# Patient Record
Sex: Female | Born: 1950 | Race: White | Hispanic: No | Marital: Married | Smoking: Never smoker
Health system: Southern US, Community
[De-identification: ages and names within clinical notes are randomized; demographics above are authoritative.]

## PROBLEM LIST (undated history)

## (undated) DIAGNOSIS — F419 Anxiety disorder, unspecified: Secondary | ICD-10-CM

## (undated) DIAGNOSIS — I10 Essential (primary) hypertension: Secondary | ICD-10-CM

## (undated) DIAGNOSIS — K219 Gastro-esophageal reflux disease without esophagitis: Secondary | ICD-10-CM

## (undated) DIAGNOSIS — F411 Generalized anxiety disorder: Secondary | ICD-10-CM

## (undated) DIAGNOSIS — M1990 Unspecified osteoarthritis, unspecified site: Secondary | ICD-10-CM

## (undated) HISTORY — DX: Essential (primary) hypertension: I10

## (undated) HISTORY — PX: OTHER SURGICAL HISTORY: SHX169

## (undated) HISTORY — PX: KNEE ARTHROSCOPY W/ DEBRIDEMENT: SHX1867

## (undated) HISTORY — PX: BUNIONECTOMY: SHX129

## (undated) HISTORY — PX: CHOLECYSTECTOMY: SHX55

## (undated) HISTORY — PX: ABDOMINAL HYSTERECTOMY: SHX81

## (undated) HISTORY — PX: BACK SURGERY: SHX140

## (undated) HISTORY — DX: Generalized anxiety disorder: F41.1

## (undated) HISTORY — PX: NECK EXPLORATION: SHX2077

---

## 2015-01-31 DIAGNOSIS — Z4889 Encounter for other specified surgical aftercare: Secondary | ICD-10-CM | POA: Diagnosis not present

## 2015-01-31 DIAGNOSIS — W101XXD Fall (on)(from) sidewalk curb, subsequent encounter: Secondary | ICD-10-CM | POA: Diagnosis not present

## 2015-01-31 DIAGNOSIS — M25562 Pain in left knee: Secondary | ICD-10-CM | POA: Diagnosis not present

## 2015-01-31 DIAGNOSIS — R293 Abnormal posture: Secondary | ICD-10-CM | POA: Diagnosis not present

## 2015-01-31 DIAGNOSIS — M23307 Other meniscus derangements, unspecified meniscus, left knee: Secondary | ICD-10-CM | POA: Diagnosis not present

## 2015-01-31 DIAGNOSIS — M6281 Muscle weakness (generalized): Secondary | ICD-10-CM | POA: Diagnosis not present

## 2015-02-02 DIAGNOSIS — Z4889 Encounter for other specified surgical aftercare: Secondary | ICD-10-CM | POA: Diagnosis not present

## 2015-02-02 DIAGNOSIS — R293 Abnormal posture: Secondary | ICD-10-CM | POA: Diagnosis not present

## 2015-02-02 DIAGNOSIS — W101XXD Fall (on)(from) sidewalk curb, subsequent encounter: Secondary | ICD-10-CM | POA: Diagnosis not present

## 2015-02-02 DIAGNOSIS — M25562 Pain in left knee: Secondary | ICD-10-CM | POA: Diagnosis not present

## 2015-02-02 DIAGNOSIS — M23307 Other meniscus derangements, unspecified meniscus, left knee: Secondary | ICD-10-CM | POA: Diagnosis not present

## 2015-02-02 DIAGNOSIS — M6281 Muscle weakness (generalized): Secondary | ICD-10-CM | POA: Diagnosis not present

## 2015-02-18 DIAGNOSIS — I82409 Acute embolism and thrombosis of unspecified deep veins of unspecified lower extremity: Secondary | ICD-10-CM | POA: Diagnosis not present

## 2015-03-28 DIAGNOSIS — R0789 Other chest pain: Secondary | ICD-10-CM | POA: Diagnosis not present

## 2015-03-28 DIAGNOSIS — Z789 Other specified health status: Secondary | ICD-10-CM | POA: Diagnosis not present

## 2015-03-28 DIAGNOSIS — I10 Essential (primary) hypertension: Secondary | ICD-10-CM | POA: Diagnosis not present

## 2015-04-13 DIAGNOSIS — R5383 Other fatigue: Secondary | ICD-10-CM | POA: Diagnosis not present

## 2015-04-13 DIAGNOSIS — Z7189 Other specified counseling: Secondary | ICD-10-CM | POA: Diagnosis not present

## 2015-04-13 DIAGNOSIS — Z79899 Other long term (current) drug therapy: Secondary | ICD-10-CM | POA: Diagnosis not present

## 2015-04-13 DIAGNOSIS — E785 Hyperlipidemia, unspecified: Secondary | ICD-10-CM | POA: Diagnosis not present

## 2015-04-13 DIAGNOSIS — Z0000 Encounter for general adult medical examination without abnormal findings: Secondary | ICD-10-CM | POA: Diagnosis not present

## 2015-04-13 DIAGNOSIS — E559 Vitamin D deficiency, unspecified: Secondary | ICD-10-CM | POA: Diagnosis not present

## 2015-04-13 DIAGNOSIS — Z299 Encounter for prophylactic measures, unspecified: Secondary | ICD-10-CM | POA: Diagnosis not present

## 2015-04-13 DIAGNOSIS — Z6838 Body mass index (BMI) 38.0-38.9, adult: Secondary | ICD-10-CM | POA: Diagnosis not present

## 2015-04-13 DIAGNOSIS — Z1389 Encounter for screening for other disorder: Secondary | ICD-10-CM | POA: Diagnosis not present

## 2015-04-13 DIAGNOSIS — F419 Anxiety disorder, unspecified: Secondary | ICD-10-CM | POA: Diagnosis not present

## 2015-04-15 ENCOUNTER — Encounter (INDEPENDENT_AMBULATORY_CARE_PROVIDER_SITE_OTHER): Payer: Self-pay | Admitting: *Deleted

## 2015-04-20 DIAGNOSIS — H04123 Dry eye syndrome of bilateral lacrimal glands: Secondary | ICD-10-CM | POA: Diagnosis not present

## 2015-04-27 ENCOUNTER — Encounter (INDEPENDENT_AMBULATORY_CARE_PROVIDER_SITE_OTHER): Payer: Self-pay

## 2015-04-27 ENCOUNTER — Encounter (INDEPENDENT_AMBULATORY_CARE_PROVIDER_SITE_OTHER): Payer: Self-pay | Admitting: *Deleted

## 2015-04-27 ENCOUNTER — Other Ambulatory Visit (INDEPENDENT_AMBULATORY_CARE_PROVIDER_SITE_OTHER): Payer: Self-pay | Admitting: *Deleted

## 2015-04-27 DIAGNOSIS — Z1211 Encounter for screening for malignant neoplasm of colon: Secondary | ICD-10-CM

## 2015-05-19 DIAGNOSIS — E2839 Other primary ovarian failure: Secondary | ICD-10-CM | POA: Diagnosis not present

## 2015-05-19 DIAGNOSIS — Z1231 Encounter for screening mammogram for malignant neoplasm of breast: Secondary | ICD-10-CM | POA: Diagnosis not present

## 2015-06-01 DIAGNOSIS — L821 Other seborrheic keratosis: Secondary | ICD-10-CM | POA: Diagnosis not present

## 2015-06-01 DIAGNOSIS — D239 Other benign neoplasm of skin, unspecified: Secondary | ICD-10-CM | POA: Diagnosis not present

## 2015-06-01 DIAGNOSIS — L219 Seborrheic dermatitis, unspecified: Secondary | ICD-10-CM | POA: Diagnosis not present

## 2015-07-07 ENCOUNTER — Encounter (INDEPENDENT_AMBULATORY_CARE_PROVIDER_SITE_OTHER): Payer: Self-pay | Admitting: *Deleted

## 2015-07-07 ENCOUNTER — Telehealth (INDEPENDENT_AMBULATORY_CARE_PROVIDER_SITE_OTHER): Payer: Self-pay | Admitting: *Deleted

## 2015-07-07 ENCOUNTER — Other Ambulatory Visit (INDEPENDENT_AMBULATORY_CARE_PROVIDER_SITE_OTHER): Payer: Self-pay | Admitting: *Deleted

## 2015-07-07 MED ORDER — PEG 3350-KCL-NA BICARB-NACL 420 G PO SOLR: 4000.0000 mL | Freq: Once | ORAL | Status: DC

## 2015-07-07 NOTE — Telephone Encounter (Signed)
agree

## 2015-07-07 NOTE — Telephone Encounter (Signed)
Patient needs trilyte 

## 2015-07-07 NOTE — Telephone Encounter (Signed)
Referring MD/PCP: shah   Procedure: tcs  Reason/Indication:  screening  Has patient had this procedure before?  Yes, 10 yrs ago, scanned  If so, when, by whom and where?    Is there a family history of colon cancer?  no  Who?  What age when diagnosed?    Is patient diabetic?   no      Does patient have prosthetic heart valve or mechanical valve?  no  Do you have a pacemaker?  no  Has patient ever had endocarditis? no  Has patient had joint replacement within last 12 months?  no  Does patient tend to be constipated or take laxatives? no  Does patient have a history of alcohol/drug use?  no  Is patient on Coumadin, Plavix and/or Aspirin? yes  Medications: asa 81 mg daily, amlodipine 5/160 mg daily, hctz 25 mg daily, escitalopram 5 mg daily, diazepam 5 mg prn, dycyclomine 20 mg daily, fish oil 1200 mg daily, red yeast rice 600 mg bid, nexium 20 mg bid, potassium daily, vit d 5000 unit daily  Allergies: demerol, augmentin, lipitor  Medication Adjustment: asa 2 days  Procedure date & time: 08/04/15 at 730

## 2015-07-20 DIAGNOSIS — Z299 Encounter for prophylactic measures, unspecified: Secondary | ICD-10-CM | POA: Diagnosis not present

## 2015-07-20 DIAGNOSIS — I10 Essential (primary) hypertension: Secondary | ICD-10-CM | POA: Diagnosis not present

## 2015-07-20 DIAGNOSIS — E785 Hyperlipidemia, unspecified: Secondary | ICD-10-CM | POA: Diagnosis not present

## 2015-08-04 ENCOUNTER — Encounter (HOSPITAL_COMMUNITY)
Admission: RE | Disposition: A | Discharge status: Home / Self Care | Payer: Self-pay | Source: Ambulatory Visit | Attending: Internal Medicine

## 2015-08-04 ENCOUNTER — Ambulatory Visit (HOSPITAL_COMMUNITY)
Admission: RE | Admit: 2015-08-04 | Discharge: 2015-08-04 | Disposition: A | Discharge status: Home / Self Care | Payer: Medicare Other | Source: Ambulatory Visit | Attending: Internal Medicine | Admitting: Internal Medicine

## 2015-08-04 ENCOUNTER — Encounter (HOSPITAL_COMMUNITY): Payer: Self-pay

## 2015-08-04 DIAGNOSIS — Z90710 Acquired absence of both cervix and uterus: Secondary | ICD-10-CM | POA: Diagnosis not present

## 2015-08-04 DIAGNOSIS — K5730 Diverticulosis of large intestine without perforation or abscess without bleeding: Secondary | ICD-10-CM | POA: Insufficient documentation

## 2015-08-04 DIAGNOSIS — K644 Residual hemorrhoidal skin tags: Secondary | ICD-10-CM | POA: Insufficient documentation

## 2015-08-04 DIAGNOSIS — K6289 Other specified diseases of anus and rectum: Secondary | ICD-10-CM | POA: Insufficient documentation

## 2015-08-04 DIAGNOSIS — K219 Gastro-esophageal reflux disease without esophagitis: Secondary | ICD-10-CM | POA: Insufficient documentation

## 2015-08-04 DIAGNOSIS — F419 Anxiety disorder, unspecified: Secondary | ICD-10-CM | POA: Diagnosis not present

## 2015-08-04 DIAGNOSIS — Z9049 Acquired absence of other specified parts of digestive tract: Secondary | ICD-10-CM | POA: Insufficient documentation

## 2015-08-04 DIAGNOSIS — Z7982 Long term (current) use of aspirin: Secondary | ICD-10-CM | POA: Diagnosis not present

## 2015-08-04 DIAGNOSIS — Z881 Allergy status to other antibiotic agents status: Secondary | ICD-10-CM | POA: Diagnosis not present

## 2015-08-04 DIAGNOSIS — M1990 Unspecified osteoarthritis, unspecified site: Secondary | ICD-10-CM | POA: Insufficient documentation

## 2015-08-04 DIAGNOSIS — Z885 Allergy status to narcotic agent status: Secondary | ICD-10-CM | POA: Insufficient documentation

## 2015-08-04 DIAGNOSIS — Z888 Allergy status to other drugs, medicaments and biological substances status: Secondary | ICD-10-CM | POA: Diagnosis not present

## 2015-08-04 DIAGNOSIS — Z79899 Other long term (current) drug therapy: Secondary | ICD-10-CM | POA: Diagnosis not present

## 2015-08-04 DIAGNOSIS — I10 Essential (primary) hypertension: Secondary | ICD-10-CM | POA: Insufficient documentation

## 2015-08-04 DIAGNOSIS — Z1211 Encounter for screening for malignant neoplasm of colon: Secondary | ICD-10-CM | POA: Diagnosis not present

## 2015-08-04 HISTORY — PX: COLONOSCOPY: SHX5424

## 2015-08-04 HISTORY — DX: Gastro-esophageal reflux disease without esophagitis: K21.9

## 2015-08-04 HISTORY — DX: Anxiety disorder, unspecified: F41.9

## 2015-08-04 HISTORY — DX: Essential (primary) hypertension: I10

## 2015-08-04 HISTORY — DX: Unspecified osteoarthritis, unspecified site: M19.90

## 2015-08-04 SURGERY — COLONOSCOPY
Anesthesia: Moderate Sedation

## 2015-08-04 MED ORDER — MIDAZOLAM HCL 5 MG/5ML IJ SOLN
INTRAMUSCULAR | Status: DC | PRN
  Administered 2015-08-04 (×5): 2 mg via INTRAVENOUS

## 2015-08-04 MED ORDER — STERILE WATER FOR IRRIGATION IR SOLN
Status: DC | PRN
  Administered 2015-08-04: 2.5 mL

## 2015-08-04 MED ORDER — SODIUM CHLORIDE 0.9 % IV SOLN
INTRAVENOUS | Status: DC
  Administered 2015-08-04: 07:00:00 via INTRAVENOUS

## 2015-08-04 MED ORDER — MIDAZOLAM HCL 5 MG/5ML IJ SOLN
INTRAMUSCULAR | Status: AC
  Filled 2015-08-04: qty 10

## 2015-08-04 MED ORDER — FENTANYL CITRATE (PF) 100 MCG/2ML IJ SOLN
INTRAMUSCULAR | Status: DC
Start: 2015-08-04 — End: 2015-08-04
  Filled 2015-08-04: qty 2

## 2015-08-04 MED ORDER — FENTANYL CITRATE (PF) 100 MCG/2ML IJ SOLN
INTRAMUSCULAR | Status: DC | PRN
Start: 2015-08-04 — End: 2015-08-04
  Administered 2015-08-04 (×2): 25 ug via INTRAVENOUS

## 2015-08-04 MED ORDER — MEPERIDINE HCL 50 MG/ML IJ SOLN
INTRAMUSCULAR | Status: AC
  Filled 2015-08-04: qty 1

## 2015-08-04 NOTE — Discharge Instructions (Signed)
Resume usual medications and high fiber diet. °No driving for 24 hours. °Next screening exam in 10 years. ° °Colonoscopy, Care After °Refer to this sheet in the next few weeks. These instructions provide you with information on caring for yourself after your procedure. Your health care provider may also give you more specific instructions. Your treatment has been planned according to current medical practices, but problems sometimes occur. Call your health care provider if you have any problems or questions after your procedure. °WHAT TO EXPECT AFTER THE PROCEDURE  °After your procedure, it is typical to have the following: °· A small amount of blood in your stool. °· Moderate amounts of gas and mild abdominal cramping or bloating. °HOME CARE INSTRUCTIONS °· Do not drive, operate machinery, or sign important documents for 24 hours. °· You may shower and resume your regular physical activities, but move at a slower pace for the first 24 hours. °· Take frequent rest periods for the first 24 hours. °· Walk around or put a warm pack on your abdomen to help reduce abdominal cramping and bloating. °· Drink enough fluids to keep your urine clear or pale yellow. °· You may resume your normal diet as instructed by your health care provider. Avoid heavy or fried foods that are hard to digest. °· Avoid drinking alcohol for 24 hours or as instructed by your health care provider. °· Only take over-the-counter or prescription medicines as directed by your health care provider. °· If a tissue sample (biopsy) was taken during your procedure: °¨ Do not take aspirin or blood thinners for 7 days, or as instructed by your health care provider. °¨ Do not drink alcohol for 7 days, or as instructed by your health care provider. °¨ Eat soft foods for the first 24 hours. °SEEK MEDICAL CARE IF: °You have persistent spotting of blood in your stool 2-3 days after the procedure. °SEEK IMMEDIATE MEDICAL CARE IF: °· You have more than a small  spotting of blood in your stool. °· You pass large blood clots in your stool. °· Your abdomen is swollen (distended). °· You have nausea or vomiting. °· You have a fever. °· You have increasing abdominal pain that is not relieved with medicine. °  °This information is not intended to replace advice given to you by your health care provider. Make sure you discuss any questions you have with your health care provider. °  °Document Released: 08/30/2003 Document Revised: 11/05/2012 Document Reviewed: 09/22/2012 °Elsevier Interactive Patient Education ©2016 Elsevier Inc. °High-Fiber Diet °Fiber, also called dietary fiber, is a type of carbohydrate found in fruits, vegetables, whole grains, and beans. A high-fiber diet can have many health benefits. Your health care provider may recommend a high-fiber diet to help: °· Prevent constipation. Fiber can make your bowel movements more regular. °· Lower your cholesterol. °· Relieve hemorrhoids, uncomplicated diverticulosis, or irritable bowel syndrome. °· Prevent overeating as part of a weight-loss plan. °· Prevent heart disease, type 2 diabetes, and certain cancers. °WHAT IS MY PLAN? °The recommended daily intake of fiber includes: °· 38 grams for men under age 50. °· 30 grams for men over age 50. °· 25 grams for women under age 50. °· 21 grams for women over age 50. °You can get the recommended daily intake of dietary fiber by eating a variety of fruits, vegetables, grains, and beans. Your health care provider may also recommend a fiber supplement if it is not possible to get enough fiber through your diet. °WHAT DO I   TO KNOW ABOUT A HIGH-FIBER DIET?  Fiber supplements have not been widely studied for their effectiveness, so it is better to get fiber through food sources.  Always check the fiber content on thenutrition facts label of any prepackaged food. Look for foods that contain at least 5 grams of fiber per serving.  Ask your dietitian if you have questions  about specific foods that are related to your condition, especially if those foods are not listed in the following section.  Increase your daily fiber consumption gradually. Increasing your intake of dietary fiber too quickly may cause bloating, cramping, or gas.  Drink plenty of water. Water helps you to digest fiber. WHAT FOODS CAN I EAT? Grains Whole-grain breads. Multigrain cereal. Oats and oatmeal. Brown rice. Barley. Bulgur wheat. [place]. Bran muffins. Popcorn. Rye wafer crackers. Vegetables Sweet potatoes. Spinach. Kale. Artichokes. Cabbage. Broccoli. Green peas. Carrots. Squash. Fruits Berries. Pears. Apples. Oranges. Avocados. Prunes and raisins. Dried figs. Meats and Other Protein Sources [place], kidney, pinto, and soy beans. Split peas. Lentils. Nuts and seeds. Dairy Fiber-fortified yogurt. Beverages Fiber-fortified soy milk. Fiber-fortified orange juice. Other Fiber bars. The items listed above may not be a complete list of recommended foods or beverages. Contact your dietitian for more options. WHAT FOODS ARE NOT RECOMMENDED? Grains White bread. Pasta made with refined flour. White rice. Vegetables Fried potatoes. Canned vegetables. Well-cooked vegetables.  Fruits Fruit juice. Cooked, strained fruit. Meats and Other Protein Sources Fatty cuts of meat. Fried Sales executive or fried fish. Dairy Milk. Yogurt. Cream cheese. Sour cream. Beverages Soft drinks. Other Cakes and pastries. Butter and oils. The items listed above may not be a complete list of foods and beverages to avoid. Contact your dietitian for more information. WHAT ARE SOME TIPS FOR INCLUDING HIGH-FIBER FOODS IN MY DIET?  Eat a wide variety of high-fiber foods.  Make sure that half of all grains consumed each day are whole grains.  Replace breads and cereals made from refined flour or white flour with whole-grain breads and cereals.  Replace white rice with brown rice, bulgur wheat, or millet.  Start the  day with a breakfast that is high in fiber, such as a cereal that contains at least 5 grams of fiber per serving.  Use beans in place of meat in soups, salads, or pasta.  Eat high-fiber snacks, such as berries, raw vegetables, nuts, or popcorn.   This information is not intended to replace advice given to you by your health care provider. Make sure you discuss any questions you have with your health care provider.   Document Released: 01/15/2005 Document Revised: 02/05/2014 Document Reviewed: 06/30/2013 Elsevier Interactive Patient Education [employer].

## 2015-08-04 NOTE — Op Note (Signed)
[facility] Patient Name: Robin Oneill Procedure Date: 08/04/2015 7:21 AM MRN: [id number] Date of Birth: [birth year] Attending MD: Hildred Laser , MD CSN: [id number] Age: 65 Admit Type: Outpatient Procedure:                Colonoscopy Indications:              Screening for colorectal malignant neoplasm Providers:                Hildred Laser, MD, Lurline Del, RN, Isabella Stalling,                            Technician Referring MD:             Joaquin Courts Medicines:                Fentanyl 75 micrograms IV, Midazolam 10 mg IV Complications:            No immediate complications. Estimated Blood Loss:     Estimated blood loss: none. Procedure:                Pre-Anesthesia Assessment:                           - Prior to the procedure, a History and Physical                            was performed, and patient medications and                            allergies were reviewed. The patient's tolerance of                            previous anesthesia was also reviewed. The risks                            and benefits of the procedure and the sedation                            options and risks were discussed with the patient.                            All questions were answered, and informed consent                            was obtained. Prior Anticoagulants: The patient                            last took aspirin 2 days prior to the procedure.                            ASA Grade Assessment: II - A patient with mild                            systemic disease. After reviewing the risks and  benefits, the patient was deemed in satisfactory                            condition to undergo the procedure.                           After obtaining informed consent, the colonoscope                            was passed under direct vision. Throughout the                            procedure, the patient's blood pressure, pulse, and      oxygen saturations were monitored continuously. The                            [id number]) was introduced through the                            anus and advanced to the the cecum, identified by                            appendiceal orifice and ileocecal valve. The                            colonoscopy was performed without difficulty. The                            patient tolerated the procedure well. The quality                            of the bowel preparation was good. The ileocecal                            valve, appendiceal orifice, and rectum were                            photographed. Scope In: 7:44:37 AM Scope Out: 8:01:12 AM Scope Withdrawal Time: 0 hours 7 minutes 54 seconds  Total Procedure Duration: 0 hours 16 minutes 35 seconds  Findings:      Scattered medium-mouthed diverticula were found in the sigmoid colon,       descending colon, transverse colon, hepatic flexure and ascending colon.      External hemorrhoids were found during retroflexion. The hemorrhoids       were medium-sized.      Anal papilla(e) were hypertrophied. Impression:               - Diverticulosis in the sigmoid colon, in the                            descending colon, in the transverse colon, at the                            hepatic flexure and in the ascending colon.                           -  External hemorrhoids.                           - Anal papilla(e) were hypertrophied.                           - No specimens collected. Moderate Sedation:      Moderate (conscious) sedation was administered by the endoscopy nurse       and supervised by the endoscopist. The following parameters were       monitored: oxygen saturation, heart rate, blood pressure, CO2       capnography and response to care. Total physician intraservice time was       24 minutes. Recommendation:           - Patient has a contact number available for                            emergencies. The signs and  symptoms of potential                            delayed complications were discussed with the                            patient. Return to normal activities tomorrow.                            Written discharge instructions were provided to the                            patient.                           - High fiber diet today.                           - Continue present medications.                           - Resume aspirin at prior dose today.                           - Repeat colonoscopy in 10 years for screening                            purposes. Procedure Code(s):        --- Professional ---                           [phone], Colonoscopy, flexible; diagnostic, including                            collection of specimen(s) by brushing or washing,                            when performed (separate procedure)  [id number], Moderate sedation services provided by the                            same physician or other qualified health care                            professional performing the diagnostic or                            therapeutic service that the sedation supports,                            requiring the presence of an independent trained                            observer to assist in the monitoring of the                            patient's level of consciousness and physiological                            status; initial 15 minutes of intraservice time,                            patient age 63 years or older                           [phone], Moderate sedation services; each additional                            15 minutes intraservice time Diagnosis Code(s):        --- Professional ---                           Z12.11, Encounter for screening for malignant                            neoplasm of colon                           K64.4, Residual hemorrhoidal skin tags                           K62.89, Other specified diseases of anus and  rectum                           K57.30, Diverticulosis of large intestine without                            perforation or abscess without bleeding CPT copyright 2016 American Medical Association. All rights reserved. The codes documented in this report are preliminary and upon coder review may  be revised to meet current compliance requirements. Hildred Laser, MD Hildred Laser, MD 08/04/2015 8:09:42 AM This report has been signed electronically. Number of Addenda: 0

## 2015-08-04 NOTE — H&P (Signed)
Robin Oneill is an 65 y.o. female.   Chief Complaint: Patient is here for colonoscopy. HPI: Patient is 65 year old Caucasian female who is in for screening colonoscopy. She has occasional hematochezia felt to be secondary to hemorrhoids. She denies frank bleeding abdominal pain or change in bowel habits. Last colonoscopy was in May 2005. Family history is negative for CRC.  Past Medical History  Diagnosis Date  . Hypertension   . GERD (gastroesophageal reflux disease)   . Arthritis   . Anxiety     Past Surgical History  Procedure Laterality Date  . Abdominal hysterectomy    . Cholecystectomy    . Back surgery    . Bunionectomy Bilateral   . Knee arthroscopy w/ debridement    . Neck exploration      History reviewed. No pertinent family history. Social History:  reports that she has never smoked. She does not have any smokeless tobacco history on file. She reports that she does not drink alcohol or use illicit drugs.  Allergies:  Allergies  Allergen Reactions  . Augmentin [Amoxicillin-Pot Clavulanate] Diarrhea  . Demerol [Meperidine] Hives  . Lipitor [Atorvastatin]     Muscle cramps    Medications Prior to Admission  Medication Sig Dispense Refill  . amLODipine-valsartan (EXFORGE) 5-160 MG tablet Take 1 tablet by mouth daily.    Marland Kitchen aspirin EC 81 MG tablet Take 81 mg by mouth daily.    . Cholecalciferol (VITAMIN D3) 5000 units TABS Take 1 tablet by mouth daily.    Marland Kitchen escitalopram (LEXAPRO) 5 MG tablet Take 5 mg by mouth daily.    Marland Kitchen esomeprazole (NEXIUM) 20 MG capsule Take 20 mg by mouth 2 (two) times daily before a meal.    . hydrochlorothiazide (HYDRODIURIL) 25 MG tablet Take 25 mg by mouth daily.    . Omega-3 Fatty Acids (FISH OIL) 1200 MG CAPS Take 1 capsule by mouth daily.    . polyethylene glycol-electrolytes (TRILYTE) 420 g solution Take 4,000 mLs by mouth once. 4000 mL 0  . Potassium 99 MG TABS Take 1 tablet by mouth daily.    . Red Yeast Rice 600 MG CAPS Take 1  capsule by mouth 2 (two) times daily.    . TURMERIC PO Take 1 tablet by mouth daily.      No results found for this or any previous visit (from the past 48 hour(s)). No results found.  ROS  Blood pressure 138/67, pulse 72, temperature 98.2 F (36.8 C), temperature source Oral, resp. rate 14, height 5\' 4"  (1.626 m), weight 210 lb (95.255 kg), SpO2 95 %. Physical Exam  Constitutional: She appears well-developed and well-nourished.  HENT:  Mouth/Throat: Oropharynx is clear and moist.  Eyes: Conjunctivae are normal. No scleral icterus.  Neck: No thyromegaly present.  Cardiovascular: Normal rate, regular rhythm and normal heart sounds.   No murmur heard. Respiratory: Effort normal and breath sounds normal.  GI:  Abdomen is full. She has a right subcostal in lower midline scar. Abdomen is soft and nontender without organomegaly or masses.  Musculoskeletal: She exhibits no edema.  Lymphadenopathy:    She has no cervical adenopathy.  Neurological: She is alert.  Skin: Skin is warm and dry.     Assessment/Plan Average risk screening colonoscopy.  Hildred Laser, MD 08/04/2015, 7:32 AM

## 2015-08-09 ENCOUNTER — Encounter (HOSPITAL_COMMUNITY): Payer: Self-pay | Admitting: Internal Medicine

## 2015-10-20 DIAGNOSIS — Z6838 Body mass index (BMI) 38.0-38.9, adult: Secondary | ICD-10-CM | POA: Diagnosis not present

## 2015-10-24 DIAGNOSIS — H25093 Other age-related incipient cataract, bilateral: Secondary | ICD-10-CM | POA: Diagnosis not present

## 2015-10-24 DIAGNOSIS — H52223 Regular astigmatism, bilateral: Secondary | ICD-10-CM | POA: Diagnosis not present

## 2015-10-24 DIAGNOSIS — H04123 Dry eye syndrome of bilateral lacrimal glands: Secondary | ICD-10-CM | POA: Diagnosis not present

## 2015-10-24 DIAGNOSIS — H524 Presbyopia: Secondary | ICD-10-CM | POA: Diagnosis not present

## 2015-10-24 DIAGNOSIS — H5203 Hypermetropia, bilateral: Secondary | ICD-10-CM | POA: Diagnosis not present

## 2016-01-19 DIAGNOSIS — F419 Anxiety disorder, unspecified: Secondary | ICD-10-CM | POA: Diagnosis not present

## 2016-01-19 DIAGNOSIS — J320 Chronic maxillary sinusitis: Secondary | ICD-10-CM | POA: Diagnosis not present

## 2016-01-19 DIAGNOSIS — I10 Essential (primary) hypertension: Secondary | ICD-10-CM | POA: Diagnosis not present

## 2016-02-08 DIAGNOSIS — M2578 Osteophyte, vertebrae: Secondary | ICD-10-CM | POA: Diagnosis not present

## 2016-02-08 DIAGNOSIS — M546 Pain in thoracic spine: Secondary | ICD-10-CM | POA: Diagnosis not present

## 2016-02-08 DIAGNOSIS — Z713 Dietary counseling and surveillance: Secondary | ICD-10-CM | POA: Diagnosis not present

## 2016-02-08 DIAGNOSIS — I10 Essential (primary) hypertension: Secondary | ICD-10-CM | POA: Diagnosis not present

## 2016-02-08 DIAGNOSIS — Z6839 Body mass index (BMI) 39.0-39.9, adult: Secondary | ICD-10-CM | POA: Diagnosis not present

## 2016-02-08 DIAGNOSIS — Z299 Encounter for prophylactic measures, unspecified: Secondary | ICD-10-CM | POA: Diagnosis not present

## 2016-02-17 DIAGNOSIS — M549 Dorsalgia, unspecified: Secondary | ICD-10-CM | POA: Diagnosis not present

## 2016-02-17 DIAGNOSIS — M546 Pain in thoracic spine: Secondary | ICD-10-CM | POA: Diagnosis not present

## 2016-02-23 DIAGNOSIS — M4802 Spinal stenosis, cervical region: Secondary | ICD-10-CM | POA: Diagnosis not present

## 2016-02-23 DIAGNOSIS — M5124 Other intervertebral disc displacement, thoracic region: Secondary | ICD-10-CM | POA: Diagnosis not present

## 2016-02-23 DIAGNOSIS — M9982 Other biomechanical lesions of thoracic region: Secondary | ICD-10-CM | POA: Diagnosis not present

## 2016-03-06 DIAGNOSIS — M545 Low back pain: Secondary | ICD-10-CM | POA: Diagnosis not present

## 2016-03-06 DIAGNOSIS — M546 Pain in thoracic spine: Secondary | ICD-10-CM | POA: Diagnosis not present

## 2016-03-06 DIAGNOSIS — M79604 Pain in right leg: Secondary | ICD-10-CM | POA: Diagnosis not present

## 2016-03-06 DIAGNOSIS — M542 Cervicalgia: Secondary | ICD-10-CM | POA: Diagnosis not present

## 2016-03-06 DIAGNOSIS — M519 Unspecified thoracic, thoracolumbar and lumbosacral intervertebral disc disorder: Secondary | ICD-10-CM | POA: Diagnosis not present

## 2016-03-06 DIAGNOSIS — Z01812 Encounter for preprocedural laboratory examination: Secondary | ICD-10-CM | POA: Diagnosis not present

## 2016-03-06 DIAGNOSIS — M79601 Pain in right arm: Secondary | ICD-10-CM | POA: Diagnosis not present

## 2016-03-16 DIAGNOSIS — M542 Cervicalgia: Secondary | ICD-10-CM | POA: Diagnosis not present

## 2016-03-16 DIAGNOSIS — M79601 Pain in right arm: Secondary | ICD-10-CM | POA: Diagnosis not present

## 2016-03-16 DIAGNOSIS — M5116 Intervertebral disc disorders with radiculopathy, lumbar region: Secondary | ICD-10-CM | POA: Diagnosis not present

## 2016-03-28 DIAGNOSIS — M546 Pain in thoracic spine: Secondary | ICD-10-CM | POA: Diagnosis not present

## 2016-04-11 DIAGNOSIS — Z789 Other specified health status: Secondary | ICD-10-CM | POA: Diagnosis not present

## 2016-04-11 DIAGNOSIS — Z2821 Immunization not carried out because of patient refusal: Secondary | ICD-10-CM | POA: Diagnosis not present

## 2016-04-11 DIAGNOSIS — Z713 Dietary counseling and surveillance: Secondary | ICD-10-CM | POA: Diagnosis not present

## 2016-04-11 DIAGNOSIS — K529 Noninfective gastroenteritis and colitis, unspecified: Secondary | ICD-10-CM | POA: Diagnosis not present

## 2016-04-11 DIAGNOSIS — Z299 Encounter for prophylactic measures, unspecified: Secondary | ICD-10-CM | POA: Diagnosis not present

## 2016-04-11 DIAGNOSIS — Z6839 Body mass index (BMI) 39.0-39.9, adult: Secondary | ICD-10-CM | POA: Diagnosis not present

## 2016-04-17 DIAGNOSIS — K589 Irritable bowel syndrome without diarrhea: Secondary | ICD-10-CM | POA: Diagnosis not present

## 2016-04-17 DIAGNOSIS — Z6839 Body mass index (BMI) 39.0-39.9, adult: Secondary | ICD-10-CM | POA: Diagnosis not present

## 2016-04-17 DIAGNOSIS — Z713 Dietary counseling and surveillance: Secondary | ICD-10-CM | POA: Diagnosis not present

## 2016-04-17 DIAGNOSIS — Z299 Encounter for prophylactic measures, unspecified: Secondary | ICD-10-CM | POA: Diagnosis not present

## 2016-04-17 DIAGNOSIS — K219 Gastro-esophageal reflux disease without esophagitis: Secondary | ICD-10-CM | POA: Diagnosis not present

## 2016-04-17 DIAGNOSIS — Z2821 Immunization not carried out because of patient refusal: Secondary | ICD-10-CM | POA: Diagnosis not present

## 2016-04-17 DIAGNOSIS — I10 Essential (primary) hypertension: Secondary | ICD-10-CM | POA: Diagnosis not present

## 2016-04-17 DIAGNOSIS — K5790 Diverticulosis of intestine, part unspecified, without perforation or abscess without bleeding: Secondary | ICD-10-CM | POA: Diagnosis not present

## 2016-04-26 DIAGNOSIS — Z789 Other specified health status: Secondary | ICD-10-CM | POA: Diagnosis not present

## 2016-04-26 DIAGNOSIS — Z299 Encounter for prophylactic measures, unspecified: Secondary | ICD-10-CM | POA: Diagnosis not present

## 2016-04-26 DIAGNOSIS — K219 Gastro-esophageal reflux disease without esophagitis: Secondary | ICD-10-CM | POA: Diagnosis not present

## 2016-04-26 DIAGNOSIS — Z6839 Body mass index (BMI) 39.0-39.9, adult: Secondary | ICD-10-CM | POA: Diagnosis not present

## 2016-04-26 DIAGNOSIS — E785 Hyperlipidemia, unspecified: Secondary | ICD-10-CM | POA: Diagnosis not present

## 2016-04-26 DIAGNOSIS — I10 Essential (primary) hypertension: Secondary | ICD-10-CM | POA: Diagnosis not present

## 2016-04-30 DIAGNOSIS — K219 Gastro-esophageal reflux disease without esophagitis: Secondary | ICD-10-CM | POA: Diagnosis not present

## 2016-05-16 ENCOUNTER — Encounter (INDEPENDENT_AMBULATORY_CARE_PROVIDER_SITE_OTHER): Payer: Self-pay

## 2016-05-16 ENCOUNTER — Encounter (INDEPENDENT_AMBULATORY_CARE_PROVIDER_SITE_OTHER): Payer: Self-pay | Admitting: Internal Medicine

## 2016-05-28 ENCOUNTER — Encounter (INDEPENDENT_AMBULATORY_CARE_PROVIDER_SITE_OTHER): Payer: Self-pay | Admitting: Internal Medicine

## 2016-05-28 ENCOUNTER — Ambulatory Visit (INDEPENDENT_AMBULATORY_CARE_PROVIDER_SITE_OTHER): Payer: Medicare Other | Admitting: Internal Medicine

## 2016-05-28 ENCOUNTER — Other Ambulatory Visit (INDEPENDENT_AMBULATORY_CARE_PROVIDER_SITE_OTHER): Payer: Self-pay | Admitting: Internal Medicine

## 2016-05-28 VITALS — BP 132/80 | HR 64 | Temp 97.8°F | Ht 64.5 in | Wt 209.6 lb

## 2016-05-28 DIAGNOSIS — K219 Gastro-esophageal reflux disease without esophagitis: Secondary | ICD-10-CM

## 2016-05-28 DIAGNOSIS — F411 Generalized anxiety disorder: Secondary | ICD-10-CM | POA: Diagnosis not present

## 2016-05-28 DIAGNOSIS — R1032 Left lower quadrant pain: Secondary | ICD-10-CM

## 2016-05-28 DIAGNOSIS — I10 Essential (primary) hypertension: Secondary | ICD-10-CM | POA: Insufficient documentation

## 2016-05-28 HISTORY — DX: Essential (primary) hypertension: I10

## 2016-05-28 HISTORY — DX: Generalized anxiety disorder: F41.1

## 2016-05-28 NOTE — Patient Instructions (Addendum)
EGD. The risks and benefits such as perforation, bleeding, and infection were reviewed with the patient and is agreeable. 

## 2016-05-28 NOTE — Progress Notes (Signed)
Subjective:    Patient ID: Robin Oneill, female    DOB: May 02, 1950, 66 y.o.   MRN: [id number]  HPI Referred by Dr. Manuella Ghazi for abdominal pain, belching, bloating. Symptoms worse after her colonoscopy. She was started on Protonix about 4 weeks ago. She has epigastric tenderness and tenderness LLQ. The tenderness in her LLQ comes and goes. She says the LLQ is not painful. She says the Protonix has helped her acid reflux. She has early satiety due to bloating. There is no dysphagia.  Appetite for the most part is good.  There was no fever associated with her symptoms.  She says she was seen at PCP and started on Cipro for possible infection, but her symptoms did not get any better. She has a BM x 1 a day and sometimes 2-3 times a day.  05/02/2016 H. Pylori negative. 04/18/2016 H and H 14.2 and 41.6, Albumin 4.1, bili 0.6, ALP 79, AST 23, ALT 32.   08/04/2015 Colonoscopy: Dr. Laural Golden.  screening   Impression:               - Diverticulosis in the sigmoid colon, in the                            descending colon, in the transverse colon, at the                             hepatic flexure and in the ascending colon.                           - External hemorrhoids.                           - Anal papilla(e) were hypertrophied.                           - No specimens collected. Review of Systems Past Medical History:  Diagnosis Date  . Anxiety   . Anxiety state 05/28/2016  . Arthritis   . Essential hypertension, benign 05/28/2016  . GERD (gastroesophageal reflux disease)   . Hypertension     Past Surgical History:  Procedure Laterality Date  . ABDOMINAL HYSTERECTOMY    . BACK SURGERY    . BUNIONECTOMY Bilateral   . CHOLECYSTECTOMY    . COLONOSCOPY N/A 08/04/2015   Procedure: COLONOSCOPY;  Surgeon: Rogene Houston, MD;  Location: [facility];  Service: Endoscopy;  Laterality: N/A;  730  . KNEE ARTHROSCOPY W/ DEBRIDEMENT    . NECK EXPLORATION      Allergies  Allergen Reactions  .  Augmentin [Amoxicillin-Pot Clavulanate] Diarrhea  . Demerol [Meperidine] Hives  . Lipitor [Atorvastatin]     Muscle cramps    Current Outpatient Prescriptions on File Prior to Visit  Medication Sig Dispense Refill  . amLODipine-valsartan (EXFORGE) 5-160 MG tablet Take 1 tablet by mouth daily.    . hydrochlorothiazide (HYDRODIURIL) 25 MG tablet Take 25 mg by mouth daily.     No current facility-administered medications on file prior to visit.        Objective:   Physical Exam Blood pressure 132/80, pulse 64, temperature 97.8 F (36.6 C), height 5' 4.5" (1.638 m), weight 209 lb 9.6 oz (95.1 kg).  Alert and oriented. Skin warm and dry. Oral mucosa  is moist.   . Sclera anicteric, conjunctivae is pink. Thyroid not enlarged. No cervical lymphadenopathy. Lungs clear. Heart regular rate and rhythm.  Abdomen is soft. Bowel sounds are positive. No hepatomegaly. No abdominal masses felt. Slight LLQ tenderness and epigastric tenderness.  No edema to lower extremities.          Assessment & Plan:  GERD. PUD needs to be ruled out.  EGD.  LLQ pain. Am going to rule out diverticulitis. CT abdomen/pelvis with CM.

## 2016-05-29 DIAGNOSIS — K219 Gastro-esophageal reflux disease without esophagitis: Secondary | ICD-10-CM | POA: Insufficient documentation

## 2016-05-30 ENCOUNTER — Ambulatory Visit (HOSPITAL_COMMUNITY)
Admission: RE | Admit: 2016-05-30 | Discharge: 2016-05-30 | Disposition: A | Discharge status: Home / Self Care | Payer: Medicare Other | Source: Ambulatory Visit | Attending: Internal Medicine | Admitting: Internal Medicine

## 2016-05-30 ENCOUNTER — Encounter (HOSPITAL_COMMUNITY): Payer: Self-pay | Admitting: *Deleted

## 2016-05-30 ENCOUNTER — Encounter (HOSPITAL_COMMUNITY)
Admission: RE | Disposition: A | Discharge status: Home / Self Care | Payer: Self-pay | Source: Ambulatory Visit | Attending: Internal Medicine

## 2016-05-30 DIAGNOSIS — K317 Polyp of stomach and duodenum: Secondary | ICD-10-CM | POA: Diagnosis not present

## 2016-05-30 DIAGNOSIS — R1032 Left lower quadrant pain: Secondary | ICD-10-CM | POA: Insufficient documentation

## 2016-05-30 DIAGNOSIS — K2970 Gastritis, unspecified, without bleeding: Secondary | ICD-10-CM | POA: Diagnosis not present

## 2016-05-30 DIAGNOSIS — R1013 Epigastric pain: Secondary | ICD-10-CM | POA: Diagnosis not present

## 2016-05-30 DIAGNOSIS — K2950 Unspecified chronic gastritis without bleeding: Secondary | ICD-10-CM | POA: Diagnosis not present

## 2016-05-30 DIAGNOSIS — I10 Essential (primary) hypertension: Secondary | ICD-10-CM | POA: Insufficient documentation

## 2016-05-30 DIAGNOSIS — Z881 Allergy status to other antibiotic agents status: Secondary | ICD-10-CM | POA: Insufficient documentation

## 2016-05-30 DIAGNOSIS — Z8249 Family history of ischemic heart disease and other diseases of the circulatory system: Secondary | ICD-10-CM | POA: Diagnosis not present

## 2016-05-30 DIAGNOSIS — F419 Anxiety disorder, unspecified: Secondary | ICD-10-CM | POA: Diagnosis not present

## 2016-05-30 DIAGNOSIS — Z888 Allergy status to other drugs, medicaments and biological substances status: Secondary | ICD-10-CM | POA: Diagnosis not present

## 2016-05-30 DIAGNOSIS — Z809 Family history of malignant neoplasm, unspecified: Secondary | ICD-10-CM | POA: Diagnosis not present

## 2016-05-30 DIAGNOSIS — K228 Other specified diseases of esophagus: Secondary | ICD-10-CM | POA: Diagnosis not present

## 2016-05-30 DIAGNOSIS — Z9049 Acquired absence of other specified parts of digestive tract: Secondary | ICD-10-CM | POA: Diagnosis not present

## 2016-05-30 DIAGNOSIS — Z8711 Personal history of peptic ulcer disease: Secondary | ICD-10-CM | POA: Diagnosis not present

## 2016-05-30 DIAGNOSIS — K219 Gastro-esophageal reflux disease without esophagitis: Secondary | ICD-10-CM | POA: Insufficient documentation

## 2016-05-30 DIAGNOSIS — R140 Abdominal distension (gaseous): Secondary | ICD-10-CM | POA: Diagnosis not present

## 2016-05-30 DIAGNOSIS — K449 Diaphragmatic hernia without obstruction or gangrene: Secondary | ICD-10-CM | POA: Diagnosis not present

## 2016-05-30 DIAGNOSIS — Z79899 Other long term (current) drug therapy: Secondary | ICD-10-CM | POA: Diagnosis not present

## 2016-05-30 HISTORY — PX: ESOPHAGOGASTRODUODENOSCOPY: SHX5428

## 2016-05-30 SURGERY — EGD (ESOPHAGOGASTRODUODENOSCOPY)
Anesthesia: Moderate Sedation

## 2016-05-30 MED ORDER — FENTANYL CITRATE (PF) 100 MCG/2ML IJ SOLN
INTRAMUSCULAR | Status: DC | PRN
  Administered 2016-05-30 (×2): 25 ug via INTRAVENOUS

## 2016-05-30 MED ORDER — LIDOCAINE VISCOUS 2 % MT SOLN
OROMUCOSAL | Status: DC | PRN
  Administered 2016-05-30: 1 via OROMUCOSAL

## 2016-05-30 MED ORDER — MIDAZOLAM HCL 5 MG/5ML IJ SOLN
INTRAMUSCULAR | Status: AC
  Filled 2016-05-30: qty 10

## 2016-05-30 MED ORDER — FENTANYL CITRATE (PF) 100 MCG/2ML IJ SOLN
INTRAMUSCULAR | Status: AC
  Filled 2016-05-30: qty 2

## 2016-05-30 MED ORDER — LIDOCAINE VISCOUS 2 % MT SOLN
OROMUCOSAL | Status: AC
  Filled 2016-05-30: qty 15

## 2016-05-30 MED ORDER — SODIUM CHLORIDE 0.9 % IV SOLN
INTRAVENOUS | Status: DC
  Administered 2016-05-30: 1000 mL via INTRAVENOUS

## 2016-05-30 MED ORDER — MEPERIDINE HCL 50 MG/ML IJ SOLN
INTRAMUSCULAR | Status: AC
  Filled 2016-05-30: qty 1

## 2016-05-30 MED ORDER — MIDAZOLAM HCL 5 MG/5ML IJ SOLN
INTRAMUSCULAR | Status: DC | PRN
  Administered 2016-05-30 (×5): 2 mg via INTRAVENOUS

## 2016-05-30 NOTE — H&P (Signed)
Robin Oneill is an 66 y.o. female.   Chief Complaint: Patient is here for EGD. HPI: She is 66 year old Caucasian female was chronic GERD and has been maintained on PPI with control of her heartburn was been having intermittent epigastric pain over the last several weeks. She had noted postprandial bloating and burping. H. pylori serology was negative. She was switched to pantoprazole and feels better. Now she has mild pain. She also complains of irregular bowel movements and intermittent LLQ abdominal pain felt to be due to IBS. She had colonoscopy at this facility in July 2017 revealing diverticulosis. Past history significant for peptic ulcer disease.  Past Medical History:  Diagnosis Date  . Anxiety   . Anxiety state 05/28/2016  . Arthritis   . Essential hypertension, benign 05/28/2016  . GERD (gastroesophageal reflux disease)   . Hypertension        History of peptic ulcer disease.  Past Surgical History:  Procedure Laterality Date  . ABDOMINAL HYSTERECTOMY    . BACK SURGERY    . BUNIONECTOMY Bilateral   . CHOLECYSTECTOMY    . COLONOSCOPY N/A 08/04/2015   Procedure: COLONOSCOPY;  Surgeon: Rogene Houston, MD;  Location: [facility];  Service: Endoscopy;  Laterality: N/A;  730  . KNEE ARTHROSCOPY W/ DEBRIDEMENT    . low back surgery    . NECK EXPLORATION      Family History  Problem Relation Age of Onset  . Cancer Mother   . Cancer Father   . Aortic aneurysm Father   . Hypertension Brother    Social History:  reports that she has never smoked. She has never used smokeless tobacco. She reports that she does not drink alcohol or use drugs.  Allergies:  Allergies  Allergen Reactions  . Tramadol Other (See Comments)    "MADE ME LOOPY"  . Augmentin [Amoxicillin-Pot Clavulanate] Diarrhea  . Demerol [Meperidine] Hives  . Lipitor [Atorvastatin]     Muscle cramps    Medications Prior to Admission  Medication Sig Dispense Refill  . acetaminophen (TYLENOL) 500 MG tablet Take  500 mg by mouth 2 (two) times daily as needed for mild pain.    Marland Kitchen amLODipine-valsartan (EXFORGE) 5-160 MG tablet Take 1 tablet by mouth daily.    . cyclobenzaprine (FLEXERIL) 5 MG tablet Take 5 mg by mouth daily as needed for muscle spasms.    . diazepam (VALIUM) 5 MG tablet Take 5 mg by mouth daily as needed for anxiety.     Marland Kitchen doxycycline (PERIOSTAT) 20 MG tablet Take 20 mg by mouth every other day.     . hydrochlorothiazide (HYDRODIURIL) 25 MG tablet Take 25 mg by mouth daily.    . meclizine (ANTIVERT) 25 MG tablet Take 25 mg by mouth daily as needed for dizziness.    . pantoprazole (PROTONIX) 40 MG tablet Take 40 mg by mouth daily.    Marland Kitchen Propylene Glycol (SYSTANE BALANCE OP) Place 2 drops into both eyes 2 (two) times daily as needed (dry eyes).      No results found for this or any previous visit (from the past 48 hour(s)). No results found.  ROS  Blood pressure (!) 117/59, pulse 71, temperature 98.2 F (36.8 C), temperature source Oral, resp. rate 19, height 5' 4.5" (1.638 m), weight 209 lb 9.6 oz (95.1 kg), SpO2 96 %. Physical Exam  Constitutional: She appears well-developed and well-nourished.  HENT:  Mouth/Throat: Oropharynx is clear and moist.  Eyes: Conjunctivae are normal. No scleral icterus.  Neck: No  thyromegaly present.  Cardiovascular: Normal rate, regular rhythm and normal heart sounds.   No murmur heard. Respiratory: Effort normal and breath sounds normal.  GI:  Abdomen is full. It is soft with mild midepigastric tenderness. No organomegaly or masses.  Musculoskeletal: She exhibits no edema.  Lymphadenopathy:    She has no cervical adenopathy.  Neurological: She is alert.  Skin: Skin is warm and dry.     Assessment/Plan Epigastric pain and bloating. Chronic GERD.  Diagnostic EGD  Hildred Laser, MD 05/30/2016, 1:56 PM

## 2016-05-30 NOTE — Discharge Instructions (Signed)
Resume usual medications and diet. No driving for 24 hours. Physician will call with biopsy results.   Upper Endoscopy, Care After Refer to this sheet in the next few weeks. These instructions provide you with information about caring for yourself after your procedure. Your health care provider may also give you more specific instructions. Your treatment has been planned according to current medical practices, but problems sometimes occur. Call your health care provider if you have any problems or questions after your procedure. What can I expect after the procedure? After the procedure, it is common to have:  A sore throat.  Bloating.  Nausea. Follow these instructions at home:  Follow instructions from your health care provider about what to eat or drink after your procedure.  Return to your normal activities as told by your health care provider. Ask your health care provider what activities are safe for you.  Take over-the-counter and prescription medicines only as told by your health care provider.  Do not drive for 24 hours if you received a sedative.  Keep all follow-up visits as told by your health care provider. This is important. Contact a health care provider if:  You have a sore throat that lasts longer than one day.  You have trouble swallowing. Get help right away if:  You have a fever.  You vomit blood or your vomit looks like coffee grounds.  You have bloody, black, or tarry stools.  You have a severe sore throat or you cannot swallow.  You have difficulty breathing.  You have severe pain in your chest or belly. This information is not intended to replace advice given to you by your health care provider. Make sure you discuss any questions you have with your health care provider. Document Released: 07/17/2011 Document Revised: 06/23/2015 Document Reviewed: 10/28/2014 Elsevier Interactive Patient Education  2017 [employer].

## 2016-05-30 NOTE — Op Note (Signed)
[facility] Patient Name: Robin Oneill Procedure Date: 05/30/2016 1:38 PM MRN: [id number] Date of Birth: 25-Feb-1950 Attending MD: Hildred Laser , MD CSN: [id number] Age: 66 Admit Type: Outpatient Procedure:                Upper GI endoscopy Indications:              Epigastric abdominal pain, Abdominal bloating Providers:                Hildred Laser, MD, Janeece Riggers, RN, Lurline Del, RN,                            Aram Candela Referring MD:             Monico Blitz, MD Medicines:                Lidocaine spray, Meperidine 50 mg IV, Midazolam 10                            mg IV Complications:            No immediate complications. Estimated Blood Loss:     Estimated blood loss was minimal. Procedure:                Pre-Anesthesia Assessment:                           - Prior to the procedure, a History and Physical                            was performed, and patient medications and                            allergies were reviewed. The patient's tolerance of                            previous anesthesia was also reviewed. The risks                            and benefits of the procedure and the sedation                            options and risks were discussed with the patient.                            All questions were answered, and informed consent                            was obtained. Prior Anticoagulants: The patient has                            taken no previous anticoagulant or antiplatelet                            agents. ASA Grade Assessment: II - A patient with  mild systemic disease. After reviewing the risks                            and benefits, the patient was deemed in                            satisfactory condition to undergo the procedure.                           After obtaining informed consent, the endoscope was                            passed under direct vision. Throughout the   procedure, the patient's blood pressure, pulse, and                            oxygen saturations were monitored continuously. The                            EG-[id number] ([id number]) scope was introduced through the                            mouth, and advanced to the second part of duodenum.                            The upper GI endoscopy was accomplished without                            difficulty. The patient tolerated the procedure                            well. Scope In: 2:09:56 PM Scope Out: 2:20:47 PM Total Procedure Duration: 0 hours 10 minutes 51 seconds  Findings:      The examined esophagus was normal.      The Z-line was irregular and was found 35 cm from the incisors.      A 3 cm hiatal hernia was present.      A few 4 to 7 mm pedunculated and sessile polyps were found in the       gastric body. Biopsies were taken with a cold forceps for histology. The       pathology specimen was placed into Bottle Number 2.      Patchy mild inflammation characterized by congestion (edema) and       granularity was found in the gastric antrum. Biopsies were taken with a       cold forceps for histology. The pathology specimen was placed into       Bottle Number 1.      The duodenal bulb and second portion of the duodenum were normal. Impression:               - Normal esophagus.                           - Z-line irregular, 35 cm from the incisors.                           -  3 cm hiatal hernia.                           - A few gastric polyps. Biopsied.                           - Gastritis. Biopsied.                           - Normal duodenal bulb and second portion of the                            duodenum. Moderate Sedation:      Moderate (conscious) sedation was administered by the endoscopy nurse       and supervised by the endoscopist. The following parameters were       monitored: oxygen saturation, heart rate, blood pressure, CO2       capnography and response to care.  Total physician intraservice time was       19 minutes. Recommendation:           - Patient has a contact number available for                            emergencies. The signs and symptoms of potential                            delayed complications were discussed with the                            patient. Return to normal activities tomorrow.                            Written discharge instructions were provided to the                            patient.                           - Resume previous diet today.                           - Continue present medications.                           - Await pathology results. Procedure Code(s):        --- Professional ---                           [phone], Esophagogastroduodenoscopy, flexible,                            transoral; with biopsy, single or multiple                           99152, Moderate sedation services provided by the  same physician or other qualified health care                            professional performing the diagnostic or                            therapeutic service that the sedation supports,                            requiring the presence of an independent trained                            observer to assist in the monitoring of the                            patient's level of consciousness and physiological                            status; initial 15 minutes of intraservice time,                            patient age 58 years or older Diagnosis Code(s):        --- Professional ---                           K22.8, Other specified diseases of esophagus                           K44.9, Diaphragmatic hernia without obstruction or                            gangrene                           K31.7, Polyp of stomach and duodenum                           K29.70, Gastritis, unspecified, without bleeding                           R10.13, Epigastric pain                            R14.0, Abdominal distension (gaseous) CPT copyright 2016 American [employer]. All rights reserved. The codes documented in this report are preliminary and upon coder review may  be revised to meet current compliance requirements. Hildred Laser, MD Hildred Laser, MD 05/30/2016 2:32:36 PM This report has been signed electronically. Number of Addenda: 0

## 2016-06-01 DIAGNOSIS — Z299 Encounter for prophylactic measures, unspecified: Secondary | ICD-10-CM | POA: Diagnosis not present

## 2016-06-01 DIAGNOSIS — Z789 Other specified health status: Secondary | ICD-10-CM | POA: Diagnosis not present

## 2016-06-01 DIAGNOSIS — Z6838 Body mass index (BMI) 38.0-38.9, adult: Secondary | ICD-10-CM | POA: Diagnosis not present

## 2016-06-01 DIAGNOSIS — I10 Essential (primary) hypertension: Secondary | ICD-10-CM | POA: Diagnosis not present

## 2016-06-01 DIAGNOSIS — E785 Hyperlipidemia, unspecified: Secondary | ICD-10-CM | POA: Diagnosis not present

## 2016-06-01 DIAGNOSIS — J069 Acute upper respiratory infection, unspecified: Secondary | ICD-10-CM | POA: Diagnosis not present

## 2016-06-04 ENCOUNTER — Telehealth (INDEPENDENT_AMBULATORY_CARE_PROVIDER_SITE_OTHER): Payer: Self-pay | Admitting: Internal Medicine

## 2016-06-04 NOTE — Telephone Encounter (Signed)
Patient called, stated that she is calling for the results of her biopsy and to see if she still needs to have the other procedure done this Friday.  She stated that she was expecting a call from our office on Friday and never received it.  [phone] (M) [phone] (H)

## 2016-06-04 NOTE — Telephone Encounter (Signed)
Dr.Rehman is aware. He states that he is going to call the patient and talk with her. A decision will be made about test on Friday,after he talks with her.

## 2016-06-06 ENCOUNTER — Encounter (HOSPITAL_COMMUNITY): Payer: Self-pay | Admitting: Internal Medicine

## 2016-06-08 ENCOUNTER — Ambulatory Visit (HOSPITAL_COMMUNITY)
Admission: RE | Admit: 2016-06-08 | Discharge: 2016-06-08 | Disposition: A | Discharge status: Home / Self Care | Payer: Medicare Other | Source: Ambulatory Visit | Attending: Internal Medicine | Admitting: Internal Medicine

## 2016-06-08 ENCOUNTER — Encounter (INDEPENDENT_AMBULATORY_CARE_PROVIDER_SITE_OTHER): Payer: Self-pay

## 2016-06-08 DIAGNOSIS — K439 Ventral hernia without obstruction or gangrene: Secondary | ICD-10-CM | POA: Insufficient documentation

## 2016-06-08 DIAGNOSIS — M6258 Muscle wasting and atrophy, not elsewhere classified, other site: Secondary | ICD-10-CM | POA: Insufficient documentation

## 2016-06-08 DIAGNOSIS — R1032 Left lower quadrant pain: Secondary | ICD-10-CM

## 2016-06-08 DIAGNOSIS — I700 Atherosclerosis of aorta: Secondary | ICD-10-CM | POA: Diagnosis not present

## 2016-06-08 DIAGNOSIS — K5730 Diverticulosis of large intestine without perforation or abscess without bleeding: Secondary | ICD-10-CM | POA: Diagnosis not present

## 2016-06-08 LAB — POCT I-STAT CREATININE: CREATININE: 0.8 mg/dL (ref 0.44–1.00)

## 2016-06-08 MED ORDER — IOPAMIDOL (ISOVUE-300) INJECTION 61%
100.0000 mL | Freq: Once | INTRAVENOUS | Status: AC | PRN
  Administered 2016-06-08: 100 mL via INTRAVENOUS

## 2016-06-21 DIAGNOSIS — E785 Hyperlipidemia, unspecified: Secondary | ICD-10-CM | POA: Diagnosis not present

## 2016-06-21 DIAGNOSIS — Z299 Encounter for prophylactic measures, unspecified: Secondary | ICD-10-CM | POA: Diagnosis not present

## 2016-06-21 DIAGNOSIS — I10 Essential (primary) hypertension: Secondary | ICD-10-CM | POA: Diagnosis not present

## 2016-06-21 DIAGNOSIS — Z6838 Body mass index (BMI) 38.0-38.9, adult: Secondary | ICD-10-CM | POA: Diagnosis not present

## 2016-06-21 DIAGNOSIS — H6692 Otitis media, unspecified, left ear: Secondary | ICD-10-CM | POA: Diagnosis not present

## 2016-06-21 DIAGNOSIS — Z789 Other specified health status: Secondary | ICD-10-CM | POA: Diagnosis not present

## 2016-06-27 DIAGNOSIS — H60339 Swimmer's ear, unspecified ear: Secondary | ICD-10-CM | POA: Diagnosis not present

## 2016-06-27 DIAGNOSIS — H9202 Otalgia, left ear: Secondary | ICD-10-CM | POA: Diagnosis not present

## 2016-07-03 DIAGNOSIS — M792 Neuralgia and neuritis, unspecified: Secondary | ICD-10-CM | POA: Diagnosis not present

## 2016-07-03 DIAGNOSIS — H903 Sensorineural hearing loss, bilateral: Secondary | ICD-10-CM | POA: Diagnosis not present

## 2016-07-12 DIAGNOSIS — H903 Sensorineural hearing loss, bilateral: Secondary | ICD-10-CM | POA: Diagnosis not present

## 2016-07-12 DIAGNOSIS — H73892 Other specified disorders of tympanic membrane, left ear: Secondary | ICD-10-CM | POA: Diagnosis not present

## 2016-11-20 DIAGNOSIS — H524 Presbyopia: Secondary | ICD-10-CM | POA: Diagnosis not present

## 2016-11-20 DIAGNOSIS — H04123 Dry eye syndrome of bilateral lacrimal glands: Secondary | ICD-10-CM | POA: Diagnosis not present

## 2016-11-20 DIAGNOSIS — H25093 Other age-related incipient cataract, bilateral: Secondary | ICD-10-CM | POA: Diagnosis not present

## 2016-11-20 DIAGNOSIS — H52221 Regular astigmatism, right eye: Secondary | ICD-10-CM | POA: Diagnosis not present

## 2016-11-20 DIAGNOSIS — H5203 Hypermetropia, bilateral: Secondary | ICD-10-CM | POA: Diagnosis not present

## 2016-12-07 DIAGNOSIS — M1712 Unilateral primary osteoarthritis, left knee: Secondary | ICD-10-CM | POA: Diagnosis not present

## 2016-12-07 DIAGNOSIS — M1711 Unilateral primary osteoarthritis, right knee: Secondary | ICD-10-CM | POA: Diagnosis not present

## 2016-12-07 DIAGNOSIS — M170 Bilateral primary osteoarthritis of knee: Secondary | ICD-10-CM | POA: Diagnosis not present

## 2016-12-14 DIAGNOSIS — Z2821 Immunization not carried out because of patient refusal: Secondary | ICD-10-CM | POA: Diagnosis not present

## 2016-12-14 DIAGNOSIS — J309 Allergic rhinitis, unspecified: Secondary | ICD-10-CM | POA: Diagnosis not present

## 2016-12-14 DIAGNOSIS — K219 Gastro-esophageal reflux disease without esophagitis: Secondary | ICD-10-CM | POA: Diagnosis not present

## 2016-12-14 DIAGNOSIS — Z299 Encounter for prophylactic measures, unspecified: Secondary | ICD-10-CM | POA: Diagnosis not present

## 2016-12-14 DIAGNOSIS — Z789 Other specified health status: Secondary | ICD-10-CM | POA: Diagnosis not present

## 2016-12-14 DIAGNOSIS — Z6837 Body mass index (BMI) 37.0-37.9, adult: Secondary | ICD-10-CM | POA: Diagnosis not present

## 2016-12-26 DIAGNOSIS — H25093 Other age-related incipient cataract, bilateral: Secondary | ICD-10-CM | POA: Diagnosis not present

## 2016-12-26 DIAGNOSIS — H52221 Regular astigmatism, right eye: Secondary | ICD-10-CM | POA: Diagnosis not present

## 2016-12-26 DIAGNOSIS — H5203 Hypermetropia, bilateral: Secondary | ICD-10-CM | POA: Diagnosis not present

## 2016-12-26 DIAGNOSIS — H524 Presbyopia: Secondary | ICD-10-CM | POA: Diagnosis not present

## 2016-12-28 DIAGNOSIS — Z299 Encounter for prophylactic measures, unspecified: Secondary | ICD-10-CM | POA: Diagnosis not present

## 2016-12-28 DIAGNOSIS — E785 Hyperlipidemia, unspecified: Secondary | ICD-10-CM | POA: Diagnosis not present

## 2016-12-28 DIAGNOSIS — K219 Gastro-esophageal reflux disease without esophagitis: Secondary | ICD-10-CM | POA: Diagnosis not present

## 2016-12-28 DIAGNOSIS — F419 Anxiety disorder, unspecified: Secondary | ICD-10-CM | POA: Diagnosis not present

## 2016-12-28 DIAGNOSIS — Z6836 Body mass index (BMI) 36.0-36.9, adult: Secondary | ICD-10-CM | POA: Diagnosis not present

## 2016-12-28 DIAGNOSIS — I10 Essential (primary) hypertension: Secondary | ICD-10-CM | POA: Diagnosis not present

## 2017-01-11 DIAGNOSIS — I10 Essential (primary) hypertension: Secondary | ICD-10-CM | POA: Diagnosis not present

## 2017-01-11 DIAGNOSIS — E785 Hyperlipidemia, unspecified: Secondary | ICD-10-CM | POA: Diagnosis not present

## 2017-01-11 DIAGNOSIS — K219 Gastro-esophageal reflux disease without esophagitis: Secondary | ICD-10-CM | POA: Diagnosis not present

## 2017-01-11 DIAGNOSIS — K5790 Diverticulosis of intestine, part unspecified, without perforation or abscess without bleeding: Secondary | ICD-10-CM | POA: Diagnosis not present

## 2017-01-11 DIAGNOSIS — F419 Anxiety disorder, unspecified: Secondary | ICD-10-CM | POA: Diagnosis not present

## 2017-01-11 DIAGNOSIS — Z6836 Body mass index (BMI) 36.0-36.9, adult: Secondary | ICD-10-CM | POA: Diagnosis not present

## 2017-01-11 DIAGNOSIS — Z299 Encounter for prophylactic measures, unspecified: Secondary | ICD-10-CM | POA: Diagnosis not present

## 2017-01-30 DIAGNOSIS — M170 Bilateral primary osteoarthritis of knee: Secondary | ICD-10-CM | POA: Diagnosis not present

## 2017-03-06 DIAGNOSIS — I10 Essential (primary) hypertension: Secondary | ICD-10-CM | POA: Diagnosis not present

## 2017-03-06 DIAGNOSIS — R51 Headache: Secondary | ICD-10-CM | POA: Diagnosis not present

## 2017-03-06 DIAGNOSIS — R42 Dizziness and giddiness: Secondary | ICD-10-CM | POA: Diagnosis not present

## 2017-03-06 DIAGNOSIS — Z789 Other specified health status: Secondary | ICD-10-CM | POA: Diagnosis not present

## 2017-03-06 DIAGNOSIS — Z6837 Body mass index (BMI) 37.0-37.9, adult: Secondary | ICD-10-CM | POA: Diagnosis not present

## 2017-03-06 DIAGNOSIS — Z299 Encounter for prophylactic measures, unspecified: Secondary | ICD-10-CM | POA: Diagnosis not present

## 2017-03-08 DIAGNOSIS — I6782 Cerebral ischemia: Secondary | ICD-10-CM | POA: Diagnosis not present

## 2017-03-08 DIAGNOSIS — R51 Headache: Secondary | ICD-10-CM | POA: Diagnosis not present

## 2017-03-08 DIAGNOSIS — H539 Unspecified visual disturbance: Secondary | ICD-10-CM | POA: Diagnosis not present

## 2017-03-19 DIAGNOSIS — Z6837 Body mass index (BMI) 37.0-37.9, adult: Secondary | ICD-10-CM | POA: Diagnosis not present

## 2017-03-19 DIAGNOSIS — Z713 Dietary counseling and surveillance: Secondary | ICD-10-CM | POA: Diagnosis not present

## 2017-03-19 DIAGNOSIS — Z299 Encounter for prophylactic measures, unspecified: Secondary | ICD-10-CM | POA: Diagnosis not present

## 2017-03-19 DIAGNOSIS — I10 Essential (primary) hypertension: Secondary | ICD-10-CM | POA: Diagnosis not present

## 2017-03-19 DIAGNOSIS — F329 Major depressive disorder, single episode, unspecified: Secondary | ICD-10-CM | POA: Diagnosis not present

## 2017-04-02 DIAGNOSIS — S61259A Open bite of unspecified finger without damage to nail, initial encounter: Secondary | ICD-10-CM | POA: Diagnosis not present

## 2017-04-02 DIAGNOSIS — Z299 Encounter for prophylactic measures, unspecified: Secondary | ICD-10-CM | POA: Diagnosis not present

## 2017-04-02 DIAGNOSIS — Z6837 Body mass index (BMI) 37.0-37.9, adult: Secondary | ICD-10-CM | POA: Diagnosis not present

## 2017-04-02 DIAGNOSIS — W540XXA Bitten by dog, initial encounter: Secondary | ICD-10-CM | POA: Diagnosis not present

## 2017-04-02 DIAGNOSIS — I10 Essential (primary) hypertension: Secondary | ICD-10-CM | POA: Diagnosis not present

## 2017-04-16 DIAGNOSIS — Z6838 Body mass index (BMI) 38.0-38.9, adult: Secondary | ICD-10-CM | POA: Diagnosis not present

## 2017-04-16 DIAGNOSIS — I10 Essential (primary) hypertension: Secondary | ICD-10-CM | POA: Diagnosis not present

## 2017-04-16 DIAGNOSIS — F419 Anxiety disorder, unspecified: Secondary | ICD-10-CM | POA: Diagnosis not present

## 2017-04-16 DIAGNOSIS — Z299 Encounter for prophylactic measures, unspecified: Secondary | ICD-10-CM | POA: Diagnosis not present

## 2017-04-16 DIAGNOSIS — E785 Hyperlipidemia, unspecified: Secondary | ICD-10-CM | POA: Diagnosis not present

## 2017-04-16 DIAGNOSIS — F418 Other specified anxiety disorders: Secondary | ICD-10-CM | POA: Diagnosis not present

## 2017-06-12 DIAGNOSIS — Z1331 Encounter for screening for depression: Secondary | ICD-10-CM | POA: Diagnosis not present

## 2017-06-12 DIAGNOSIS — E785 Hyperlipidemia, unspecified: Secondary | ICD-10-CM | POA: Diagnosis not present

## 2017-06-12 DIAGNOSIS — Z6838 Body mass index (BMI) 38.0-38.9, adult: Secondary | ICD-10-CM | POA: Diagnosis not present

## 2017-06-12 DIAGNOSIS — Z79899 Other long term (current) drug therapy: Secondary | ICD-10-CM | POA: Diagnosis not present

## 2017-06-12 DIAGNOSIS — E559 Vitamin D deficiency, unspecified: Secondary | ICD-10-CM | POA: Diagnosis not present

## 2017-06-12 DIAGNOSIS — Z7189 Other specified counseling: Secondary | ICD-10-CM | POA: Diagnosis not present

## 2017-06-12 DIAGNOSIS — Z1339 Encounter for screening examination for other mental health and behavioral disorders: Secondary | ICD-10-CM | POA: Diagnosis not present

## 2017-06-12 DIAGNOSIS — Z299 Encounter for prophylactic measures, unspecified: Secondary | ICD-10-CM | POA: Diagnosis not present

## 2017-06-12 DIAGNOSIS — Z0000 Encounter for general adult medical examination without abnormal findings: Secondary | ICD-10-CM | POA: Diagnosis not present

## 2017-06-12 DIAGNOSIS — Z1211 Encounter for screening for malignant neoplasm of colon: Secondary | ICD-10-CM | POA: Diagnosis not present

## 2017-06-12 DIAGNOSIS — I10 Essential (primary) hypertension: Secondary | ICD-10-CM | POA: Diagnosis not present

## 2017-06-17 DIAGNOSIS — Z1231 Encounter for screening mammogram for malignant neoplasm of breast: Secondary | ICD-10-CM | POA: Diagnosis not present

## 2017-07-25 DIAGNOSIS — M170 Bilateral primary osteoarthritis of knee: Secondary | ICD-10-CM | POA: Diagnosis not present

## 2017-08-05 DIAGNOSIS — Z713 Dietary counseling and surveillance: Secondary | ICD-10-CM | POA: Diagnosis not present

## 2017-08-05 DIAGNOSIS — M5440 Lumbago with sciatica, unspecified side: Secondary | ICD-10-CM | POA: Diagnosis not present

## 2017-08-05 DIAGNOSIS — M5136 Other intervertebral disc degeneration, lumbar region: Secondary | ICD-10-CM | POA: Diagnosis not present

## 2017-08-05 DIAGNOSIS — Z981 Arthrodesis status: Secondary | ICD-10-CM | POA: Diagnosis not present

## 2017-08-05 DIAGNOSIS — I10 Essential (primary) hypertension: Secondary | ICD-10-CM | POA: Diagnosis not present

## 2017-08-05 DIAGNOSIS — Z299 Encounter for prophylactic measures, unspecified: Secondary | ICD-10-CM | POA: Diagnosis not present

## 2017-08-05 DIAGNOSIS — Z6838 Body mass index (BMI) 38.0-38.9, adult: Secondary | ICD-10-CM | POA: Diagnosis not present

## 2017-08-05 DIAGNOSIS — M545 Low back pain: Secondary | ICD-10-CM | POA: Diagnosis not present

## 2017-08-07 DIAGNOSIS — M1990 Unspecified osteoarthritis, unspecified site: Secondary | ICD-10-CM | POA: Diagnosis not present

## 2017-08-07 DIAGNOSIS — Z981 Arthrodesis status: Secondary | ICD-10-CM | POA: Diagnosis not present

## 2017-08-07 DIAGNOSIS — E559 Vitamin D deficiency, unspecified: Secondary | ICD-10-CM | POA: Diagnosis not present

## 2017-08-07 DIAGNOSIS — M5442 Lumbago with sciatica, left side: Secondary | ICD-10-CM | POA: Diagnosis not present

## 2017-08-07 DIAGNOSIS — K219 Gastro-esophageal reflux disease without esophagitis: Secondary | ICD-10-CM | POA: Diagnosis not present

## 2017-08-07 DIAGNOSIS — E7800 Pure hypercholesterolemia, unspecified: Secondary | ICD-10-CM | POA: Diagnosis not present

## 2017-08-07 DIAGNOSIS — F419 Anxiety disorder, unspecified: Secondary | ICD-10-CM | POA: Diagnosis not present

## 2017-08-07 DIAGNOSIS — E785 Hyperlipidemia, unspecified: Secondary | ICD-10-CM | POA: Diagnosis not present

## 2017-08-07 DIAGNOSIS — Z6830 Body mass index (BMI) 30.0-30.9, adult: Secondary | ICD-10-CM | POA: Diagnosis not present

## 2017-08-07 DIAGNOSIS — M5136 Other intervertebral disc degeneration, lumbar region: Secondary | ICD-10-CM | POA: Diagnosis not present

## 2017-08-07 DIAGNOSIS — Z79899 Other long term (current) drug therapy: Secondary | ICD-10-CM | POA: Diagnosis not present

## 2017-08-07 DIAGNOSIS — M542 Cervicalgia: Secondary | ICD-10-CM | POA: Diagnosis not present

## 2017-08-07 DIAGNOSIS — E669 Obesity, unspecified: Secondary | ICD-10-CM | POA: Diagnosis not present

## 2017-08-07 DIAGNOSIS — M5441 Lumbago with sciatica, right side: Secondary | ICD-10-CM | POA: Diagnosis not present

## 2017-08-07 DIAGNOSIS — I10 Essential (primary) hypertension: Secondary | ICD-10-CM | POA: Diagnosis not present

## 2017-08-14 DIAGNOSIS — Z981 Arthrodesis status: Secondary | ICD-10-CM | POA: Diagnosis not present

## 2017-08-14 DIAGNOSIS — M519 Unspecified thoracic, thoracolumbar and lumbosacral intervertebral disc disorder: Secondary | ICD-10-CM | POA: Diagnosis not present

## 2017-08-20 DIAGNOSIS — M5136 Other intervertebral disc degeneration, lumbar region: Secondary | ICD-10-CM | POA: Diagnosis not present

## 2017-08-20 DIAGNOSIS — M47817 Spondylosis without myelopathy or radiculopathy, lumbosacral region: Secondary | ICD-10-CM | POA: Diagnosis not present

## 2017-08-20 DIAGNOSIS — M48061 Spinal stenosis, lumbar region without neurogenic claudication: Secondary | ICD-10-CM | POA: Diagnosis not present

## 2017-08-20 DIAGNOSIS — Z981 Arthrodesis status: Secondary | ICD-10-CM | POA: Diagnosis not present

## 2017-08-20 DIAGNOSIS — M5127 Other intervertebral disc displacement, lumbosacral region: Secondary | ICD-10-CM | POA: Diagnosis not present

## 2017-08-20 DIAGNOSIS — M47896 Other spondylosis, lumbar region: Secondary | ICD-10-CM | POA: Diagnosis not present

## 2017-08-21 DIAGNOSIS — M1711 Unilateral primary osteoarthritis, right knee: Secondary | ICD-10-CM | POA: Diagnosis not present

## 2017-08-21 DIAGNOSIS — G8929 Other chronic pain: Secondary | ICD-10-CM | POA: Diagnosis not present

## 2017-08-21 DIAGNOSIS — M519 Unspecified thoracic, thoracolumbar and lumbosacral intervertebral disc disorder: Secondary | ICD-10-CM | POA: Diagnosis not present

## 2017-08-21 DIAGNOSIS — M545 Low back pain: Secondary | ICD-10-CM | POA: Diagnosis not present

## 2017-08-21 DIAGNOSIS — Z981 Arthrodesis status: Secondary | ICD-10-CM | POA: Diagnosis not present

## 2017-09-17 DIAGNOSIS — I10 Essential (primary) hypertension: Secondary | ICD-10-CM | POA: Diagnosis not present

## 2017-09-17 DIAGNOSIS — K5790 Diverticulosis of intestine, part unspecified, without perforation or abscess without bleeding: Secondary | ICD-10-CM | POA: Diagnosis not present

## 2017-09-17 DIAGNOSIS — Z299 Encounter for prophylactic measures, unspecified: Secondary | ICD-10-CM | POA: Diagnosis not present

## 2017-09-17 DIAGNOSIS — F419 Anxiety disorder, unspecified: Secondary | ICD-10-CM | POA: Diagnosis not present

## 2017-09-17 DIAGNOSIS — Z6838 Body mass index (BMI) 38.0-38.9, adult: Secondary | ICD-10-CM | POA: Diagnosis not present

## 2017-09-17 DIAGNOSIS — E785 Hyperlipidemia, unspecified: Secondary | ICD-10-CM | POA: Diagnosis not present

## 2017-09-23 DIAGNOSIS — M5416 Radiculopathy, lumbar region: Secondary | ICD-10-CM | POA: Diagnosis not present

## 2017-09-23 DIAGNOSIS — M545 Low back pain: Secondary | ICD-10-CM | POA: Diagnosis not present

## 2017-10-15 DIAGNOSIS — Z23 Encounter for immunization: Secondary | ICD-10-CM | POA: Diagnosis not present

## 2017-10-15 DIAGNOSIS — Z01812 Encounter for preprocedural laboratory examination: Secondary | ICD-10-CM | POA: Diagnosis not present

## 2017-10-15 DIAGNOSIS — Z01810 Encounter for preprocedural cardiovascular examination: Secondary | ICD-10-CM | POA: Diagnosis not present

## 2017-10-15 DIAGNOSIS — M1711 Unilateral primary osteoarthritis, right knee: Secondary | ICD-10-CM | POA: Diagnosis not present

## 2017-10-21 DIAGNOSIS — M5416 Radiculopathy, lumbar region: Secondary | ICD-10-CM | POA: Diagnosis not present

## 2017-10-22 DIAGNOSIS — F419 Anxiety disorder, unspecified: Secondary | ICD-10-CM | POA: Diagnosis not present

## 2017-10-22 DIAGNOSIS — Z01818 Encounter for other preprocedural examination: Secondary | ICD-10-CM | POA: Diagnosis not present

## 2017-10-22 DIAGNOSIS — Z6838 Body mass index (BMI) 38.0-38.9, adult: Secondary | ICD-10-CM | POA: Diagnosis not present

## 2017-10-22 DIAGNOSIS — Z299 Encounter for prophylactic measures, unspecified: Secondary | ICD-10-CM | POA: Diagnosis not present

## 2017-10-22 DIAGNOSIS — I10 Essential (primary) hypertension: Secondary | ICD-10-CM | POA: Diagnosis not present

## 2017-10-22 DIAGNOSIS — Z713 Dietary counseling and surveillance: Secondary | ICD-10-CM | POA: Diagnosis not present

## 2017-11-07 DIAGNOSIS — Z96651 Presence of right artificial knee joint: Secondary | ICD-10-CM | POA: Diagnosis not present

## 2017-11-07 DIAGNOSIS — Z9889 Other specified postprocedural states: Secondary | ICD-10-CM | POA: Diagnosis not present

## 2017-11-07 DIAGNOSIS — E669 Obesity, unspecified: Secondary | ICD-10-CM | POA: Diagnosis not present

## 2017-11-07 DIAGNOSIS — Z736 Limitation of activities due to disability: Secondary | ICD-10-CM | POA: Diagnosis not present

## 2017-11-07 DIAGNOSIS — Z86718 Personal history of other venous thrombosis and embolism: Secondary | ICD-10-CM | POA: Diagnosis not present

## 2017-11-07 DIAGNOSIS — G8918 Other acute postprocedural pain: Secondary | ICD-10-CM | POA: Diagnosis not present

## 2017-11-07 DIAGNOSIS — D649 Anemia, unspecified: Secondary | ICD-10-CM | POA: Diagnosis not present

## 2017-11-07 DIAGNOSIS — M1711 Unilateral primary osteoarthritis, right knee: Secondary | ICD-10-CM | POA: Diagnosis not present

## 2017-11-07 DIAGNOSIS — Z7901 Long term (current) use of anticoagulants: Secondary | ICD-10-CM | POA: Diagnosis not present

## 2017-11-07 DIAGNOSIS — I10 Essential (primary) hypertension: Secondary | ICD-10-CM | POA: Diagnosis not present

## 2017-11-07 DIAGNOSIS — Z6835 Body mass index (BMI) 35.0-35.9, adult: Secondary | ICD-10-CM | POA: Diagnosis not present

## 2017-11-08 DIAGNOSIS — Z7901 Long term (current) use of anticoagulants: Secondary | ICD-10-CM | POA: Diagnosis not present

## 2017-11-08 DIAGNOSIS — Z6835 Body mass index (BMI) 35.0-35.9, adult: Secondary | ICD-10-CM | POA: Diagnosis not present

## 2017-11-08 DIAGNOSIS — M1711 Unilateral primary osteoarthritis, right knee: Secondary | ICD-10-CM | POA: Diagnosis not present

## 2017-11-08 DIAGNOSIS — I10 Essential (primary) hypertension: Secondary | ICD-10-CM | POA: Diagnosis not present

## 2017-11-08 DIAGNOSIS — D649 Anemia, unspecified: Secondary | ICD-10-CM | POA: Diagnosis not present

## 2017-11-08 DIAGNOSIS — E669 Obesity, unspecified: Secondary | ICD-10-CM | POA: Diagnosis not present

## 2017-11-09 DIAGNOSIS — Z96651 Presence of right artificial knee joint: Secondary | ICD-10-CM | POA: Diagnosis not present

## 2017-11-09 DIAGNOSIS — I10 Essential (primary) hypertension: Secondary | ICD-10-CM | POA: Diagnosis not present

## 2017-11-09 DIAGNOSIS — Z471 Aftercare following joint replacement surgery: Secondary | ICD-10-CM | POA: Diagnosis not present

## 2017-11-09 DIAGNOSIS — Z7902 Long term (current) use of antithrombotics/antiplatelets: Secondary | ICD-10-CM | POA: Diagnosis not present

## 2017-11-09 DIAGNOSIS — Z86718 Personal history of other venous thrombosis and embolism: Secondary | ICD-10-CM | POA: Diagnosis not present

## 2017-11-09 DIAGNOSIS — F419 Anxiety disorder, unspecified: Secondary | ICD-10-CM | POA: Diagnosis not present

## 2017-11-11 DIAGNOSIS — Z86718 Personal history of other venous thrombosis and embolism: Secondary | ICD-10-CM | POA: Diagnosis not present

## 2017-11-11 DIAGNOSIS — Z96651 Presence of right artificial knee joint: Secondary | ICD-10-CM | POA: Diagnosis not present

## 2017-11-11 DIAGNOSIS — Z471 Aftercare following joint replacement surgery: Secondary | ICD-10-CM | POA: Diagnosis not present

## 2017-11-11 DIAGNOSIS — I10 Essential (primary) hypertension: Secondary | ICD-10-CM | POA: Diagnosis not present

## 2017-11-11 DIAGNOSIS — F419 Anxiety disorder, unspecified: Secondary | ICD-10-CM | POA: Diagnosis not present

## 2017-11-11 DIAGNOSIS — Z7902 Long term (current) use of antithrombotics/antiplatelets: Secondary | ICD-10-CM | POA: Diagnosis not present

## 2017-11-13 DIAGNOSIS — F419 Anxiety disorder, unspecified: Secondary | ICD-10-CM | POA: Diagnosis not present

## 2017-11-13 DIAGNOSIS — Z96651 Presence of right artificial knee joint: Secondary | ICD-10-CM | POA: Diagnosis not present

## 2017-11-13 DIAGNOSIS — Z471 Aftercare following joint replacement surgery: Secondary | ICD-10-CM | POA: Diagnosis not present

## 2017-11-13 DIAGNOSIS — Z7902 Long term (current) use of antithrombotics/antiplatelets: Secondary | ICD-10-CM | POA: Diagnosis not present

## 2017-11-13 DIAGNOSIS — Z86718 Personal history of other venous thrombosis and embolism: Secondary | ICD-10-CM | POA: Diagnosis not present

## 2017-11-13 DIAGNOSIS — I10 Essential (primary) hypertension: Secondary | ICD-10-CM | POA: Diagnosis not present

## 2017-11-15 DIAGNOSIS — Z7902 Long term (current) use of antithrombotics/antiplatelets: Secondary | ICD-10-CM | POA: Diagnosis not present

## 2017-11-15 DIAGNOSIS — Z96651 Presence of right artificial knee joint: Secondary | ICD-10-CM | POA: Diagnosis not present

## 2017-11-15 DIAGNOSIS — Z86718 Personal history of other venous thrombosis and embolism: Secondary | ICD-10-CM | POA: Diagnosis not present

## 2017-11-15 DIAGNOSIS — Z471 Aftercare following joint replacement surgery: Secondary | ICD-10-CM | POA: Diagnosis not present

## 2017-11-15 DIAGNOSIS — F419 Anxiety disorder, unspecified: Secondary | ICD-10-CM | POA: Diagnosis not present

## 2017-11-15 DIAGNOSIS — I10 Essential (primary) hypertension: Secondary | ICD-10-CM | POA: Diagnosis not present

## 2017-11-18 DIAGNOSIS — Z96651 Presence of right artificial knee joint: Secondary | ICD-10-CM | POA: Diagnosis not present

## 2017-11-18 DIAGNOSIS — Z7902 Long term (current) use of antithrombotics/antiplatelets: Secondary | ICD-10-CM | POA: Diagnosis not present

## 2017-11-18 DIAGNOSIS — Z471 Aftercare following joint replacement surgery: Secondary | ICD-10-CM | POA: Diagnosis not present

## 2017-11-18 DIAGNOSIS — Z86718 Personal history of other venous thrombosis and embolism: Secondary | ICD-10-CM | POA: Diagnosis not present

## 2017-11-18 DIAGNOSIS — F419 Anxiety disorder, unspecified: Secondary | ICD-10-CM | POA: Diagnosis not present

## 2017-11-18 DIAGNOSIS — I10 Essential (primary) hypertension: Secondary | ICD-10-CM | POA: Diagnosis not present

## 2017-11-20 DIAGNOSIS — F419 Anxiety disorder, unspecified: Secondary | ICD-10-CM | POA: Diagnosis not present

## 2017-11-20 DIAGNOSIS — Z96651 Presence of right artificial knee joint: Secondary | ICD-10-CM | POA: Diagnosis not present

## 2017-11-20 DIAGNOSIS — I10 Essential (primary) hypertension: Secondary | ICD-10-CM | POA: Diagnosis not present

## 2017-11-20 DIAGNOSIS — Z86718 Personal history of other venous thrombosis and embolism: Secondary | ICD-10-CM | POA: Diagnosis not present

## 2017-11-20 DIAGNOSIS — Z7902 Long term (current) use of antithrombotics/antiplatelets: Secondary | ICD-10-CM | POA: Diagnosis not present

## 2017-11-20 DIAGNOSIS — Z471 Aftercare following joint replacement surgery: Secondary | ICD-10-CM | POA: Diagnosis not present

## 2017-11-22 DIAGNOSIS — Z7902 Long term (current) use of antithrombotics/antiplatelets: Secondary | ICD-10-CM | POA: Diagnosis not present

## 2017-11-22 DIAGNOSIS — Z471 Aftercare following joint replacement surgery: Secondary | ICD-10-CM | POA: Diagnosis not present

## 2017-11-22 DIAGNOSIS — I10 Essential (primary) hypertension: Secondary | ICD-10-CM | POA: Diagnosis not present

## 2017-11-22 DIAGNOSIS — Z86718 Personal history of other venous thrombosis and embolism: Secondary | ICD-10-CM | POA: Diagnosis not present

## 2017-11-22 DIAGNOSIS — Z96651 Presence of right artificial knee joint: Secondary | ICD-10-CM | POA: Diagnosis not present

## 2017-11-22 DIAGNOSIS — F419 Anxiety disorder, unspecified: Secondary | ICD-10-CM | POA: Diagnosis not present

## 2017-11-25 DIAGNOSIS — Z96651 Presence of right artificial knee joint: Secondary | ICD-10-CM | POA: Diagnosis not present

## 2017-11-25 DIAGNOSIS — F419 Anxiety disorder, unspecified: Secondary | ICD-10-CM | POA: Diagnosis not present

## 2017-11-25 DIAGNOSIS — I10 Essential (primary) hypertension: Secondary | ICD-10-CM | POA: Diagnosis not present

## 2017-11-25 DIAGNOSIS — Z471 Aftercare following joint replacement surgery: Secondary | ICD-10-CM | POA: Diagnosis not present

## 2017-11-25 DIAGNOSIS — Z86718 Personal history of other venous thrombosis and embolism: Secondary | ICD-10-CM | POA: Diagnosis not present

## 2017-11-25 DIAGNOSIS — Z7902 Long term (current) use of antithrombotics/antiplatelets: Secondary | ICD-10-CM | POA: Diagnosis not present

## 2017-11-27 DIAGNOSIS — Z96651 Presence of right artificial knee joint: Secondary | ICD-10-CM | POA: Diagnosis not present

## 2017-11-27 DIAGNOSIS — F419 Anxiety disorder, unspecified: Secondary | ICD-10-CM | POA: Diagnosis not present

## 2017-11-27 DIAGNOSIS — I10 Essential (primary) hypertension: Secondary | ICD-10-CM | POA: Diagnosis not present

## 2017-11-27 DIAGNOSIS — Z7902 Long term (current) use of antithrombotics/antiplatelets: Secondary | ICD-10-CM | POA: Diagnosis not present

## 2017-11-27 DIAGNOSIS — Z471 Aftercare following joint replacement surgery: Secondary | ICD-10-CM | POA: Diagnosis not present

## 2017-11-27 DIAGNOSIS — Z86718 Personal history of other venous thrombosis and embolism: Secondary | ICD-10-CM | POA: Diagnosis not present

## 2017-11-29 DIAGNOSIS — Z471 Aftercare following joint replacement surgery: Secondary | ICD-10-CM | POA: Diagnosis not present

## 2017-11-29 DIAGNOSIS — I10 Essential (primary) hypertension: Secondary | ICD-10-CM | POA: Diagnosis not present

## 2017-11-29 DIAGNOSIS — Z7902 Long term (current) use of antithrombotics/antiplatelets: Secondary | ICD-10-CM | POA: Diagnosis not present

## 2017-11-29 DIAGNOSIS — F419 Anxiety disorder, unspecified: Secondary | ICD-10-CM | POA: Diagnosis not present

## 2017-11-29 DIAGNOSIS — Z96651 Presence of right artificial knee joint: Secondary | ICD-10-CM | POA: Diagnosis not present

## 2017-11-29 DIAGNOSIS — Z86718 Personal history of other venous thrombosis and embolism: Secondary | ICD-10-CM | POA: Diagnosis not present

## 2017-12-02 DIAGNOSIS — F419 Anxiety disorder, unspecified: Secondary | ICD-10-CM | POA: Diagnosis not present

## 2017-12-02 DIAGNOSIS — Z7902 Long term (current) use of antithrombotics/antiplatelets: Secondary | ICD-10-CM | POA: Diagnosis not present

## 2017-12-02 DIAGNOSIS — Z86718 Personal history of other venous thrombosis and embolism: Secondary | ICD-10-CM | POA: Diagnosis not present

## 2017-12-02 DIAGNOSIS — I10 Essential (primary) hypertension: Secondary | ICD-10-CM | POA: Diagnosis not present

## 2017-12-02 DIAGNOSIS — Z471 Aftercare following joint replacement surgery: Secondary | ICD-10-CM | POA: Diagnosis not present

## 2017-12-02 DIAGNOSIS — Z96651 Presence of right artificial knee joint: Secondary | ICD-10-CM | POA: Diagnosis not present

## 2017-12-03 DIAGNOSIS — F419 Anxiety disorder, unspecified: Secondary | ICD-10-CM | POA: Diagnosis not present

## 2017-12-03 DIAGNOSIS — Z471 Aftercare following joint replacement surgery: Secondary | ICD-10-CM | POA: Diagnosis not present

## 2017-12-03 DIAGNOSIS — Z7902 Long term (current) use of antithrombotics/antiplatelets: Secondary | ICD-10-CM | POA: Diagnosis not present

## 2017-12-03 DIAGNOSIS — Z96651 Presence of right artificial knee joint: Secondary | ICD-10-CM | POA: Diagnosis not present

## 2017-12-03 DIAGNOSIS — I10 Essential (primary) hypertension: Secondary | ICD-10-CM | POA: Diagnosis not present

## 2017-12-03 DIAGNOSIS — Z86718 Personal history of other venous thrombosis and embolism: Secondary | ICD-10-CM | POA: Diagnosis not present

## 2017-12-04 DIAGNOSIS — Z7902 Long term (current) use of antithrombotics/antiplatelets: Secondary | ICD-10-CM | POA: Diagnosis not present

## 2017-12-04 DIAGNOSIS — Z96651 Presence of right artificial knee joint: Secondary | ICD-10-CM | POA: Diagnosis not present

## 2017-12-04 DIAGNOSIS — I10 Essential (primary) hypertension: Secondary | ICD-10-CM | POA: Diagnosis not present

## 2017-12-04 DIAGNOSIS — Z86718 Personal history of other venous thrombosis and embolism: Secondary | ICD-10-CM | POA: Diagnosis not present

## 2017-12-04 DIAGNOSIS — F419 Anxiety disorder, unspecified: Secondary | ICD-10-CM | POA: Diagnosis not present

## 2017-12-04 DIAGNOSIS — Z471 Aftercare following joint replacement surgery: Secondary | ICD-10-CM | POA: Diagnosis not present

## 2017-12-05 DIAGNOSIS — M6281 Muscle weakness (generalized): Secondary | ICD-10-CM | POA: Diagnosis not present

## 2017-12-05 DIAGNOSIS — R262 Difficulty in walking, not elsewhere classified: Secondary | ICD-10-CM | POA: Diagnosis not present

## 2017-12-05 DIAGNOSIS — M25561 Pain in right knee: Secondary | ICD-10-CM | POA: Diagnosis not present

## 2017-12-05 DIAGNOSIS — Z471 Aftercare following joint replacement surgery: Secondary | ICD-10-CM | POA: Diagnosis not present

## 2017-12-05 DIAGNOSIS — Z96651 Presence of right artificial knee joint: Secondary | ICD-10-CM | POA: Diagnosis not present

## 2017-12-09 DIAGNOSIS — Z471 Aftercare following joint replacement surgery: Secondary | ICD-10-CM | POA: Diagnosis not present

## 2017-12-09 DIAGNOSIS — R262 Difficulty in walking, not elsewhere classified: Secondary | ICD-10-CM | POA: Diagnosis not present

## 2017-12-09 DIAGNOSIS — Z96651 Presence of right artificial knee joint: Secondary | ICD-10-CM | POA: Diagnosis not present

## 2017-12-09 DIAGNOSIS — M25561 Pain in right knee: Secondary | ICD-10-CM | POA: Diagnosis not present

## 2017-12-09 DIAGNOSIS — M6281 Muscle weakness (generalized): Secondary | ICD-10-CM | POA: Diagnosis not present

## 2017-12-11 DIAGNOSIS — Z471 Aftercare following joint replacement surgery: Secondary | ICD-10-CM | POA: Diagnosis not present

## 2017-12-11 DIAGNOSIS — Z96651 Presence of right artificial knee joint: Secondary | ICD-10-CM | POA: Diagnosis not present

## 2017-12-11 DIAGNOSIS — M6281 Muscle weakness (generalized): Secondary | ICD-10-CM | POA: Diagnosis not present

## 2017-12-11 DIAGNOSIS — M25561 Pain in right knee: Secondary | ICD-10-CM | POA: Diagnosis not present

## 2017-12-11 DIAGNOSIS — R262 Difficulty in walking, not elsewhere classified: Secondary | ICD-10-CM | POA: Diagnosis not present

## 2017-12-13 DIAGNOSIS — M6281 Muscle weakness (generalized): Secondary | ICD-10-CM | POA: Diagnosis not present

## 2017-12-13 DIAGNOSIS — R262 Difficulty in walking, not elsewhere classified: Secondary | ICD-10-CM | POA: Diagnosis not present

## 2017-12-13 DIAGNOSIS — M25561 Pain in right knee: Secondary | ICD-10-CM | POA: Diagnosis not present

## 2017-12-13 DIAGNOSIS — Z471 Aftercare following joint replacement surgery: Secondary | ICD-10-CM | POA: Diagnosis not present

## 2017-12-13 DIAGNOSIS — Z96651 Presence of right artificial knee joint: Secondary | ICD-10-CM | POA: Diagnosis not present

## 2017-12-17 DIAGNOSIS — Z471 Aftercare following joint replacement surgery: Secondary | ICD-10-CM | POA: Diagnosis not present

## 2017-12-17 DIAGNOSIS — Z96651 Presence of right artificial knee joint: Secondary | ICD-10-CM | POA: Diagnosis not present

## 2017-12-17 DIAGNOSIS — R262 Difficulty in walking, not elsewhere classified: Secondary | ICD-10-CM | POA: Diagnosis not present

## 2017-12-17 DIAGNOSIS — M25561 Pain in right knee: Secondary | ICD-10-CM | POA: Diagnosis not present

## 2017-12-17 DIAGNOSIS — M6281 Muscle weakness (generalized): Secondary | ICD-10-CM | POA: Diagnosis not present

## 2017-12-18 DIAGNOSIS — M6281 Muscle weakness (generalized): Secondary | ICD-10-CM | POA: Diagnosis not present

## 2017-12-18 DIAGNOSIS — R262 Difficulty in walking, not elsewhere classified: Secondary | ICD-10-CM | POA: Diagnosis not present

## 2017-12-18 DIAGNOSIS — Z471 Aftercare following joint replacement surgery: Secondary | ICD-10-CM | POA: Diagnosis not present

## 2017-12-18 DIAGNOSIS — Z96651 Presence of right artificial knee joint: Secondary | ICD-10-CM | POA: Diagnosis not present

## 2017-12-18 DIAGNOSIS — M25561 Pain in right knee: Secondary | ICD-10-CM | POA: Diagnosis not present

## 2017-12-20 DIAGNOSIS — M6281 Muscle weakness (generalized): Secondary | ICD-10-CM | POA: Diagnosis not present

## 2017-12-20 DIAGNOSIS — Z96651 Presence of right artificial knee joint: Secondary | ICD-10-CM | POA: Diagnosis not present

## 2017-12-20 DIAGNOSIS — R262 Difficulty in walking, not elsewhere classified: Secondary | ICD-10-CM | POA: Diagnosis not present

## 2017-12-20 DIAGNOSIS — M25561 Pain in right knee: Secondary | ICD-10-CM | POA: Diagnosis not present

## 2017-12-20 DIAGNOSIS — Z471 Aftercare following joint replacement surgery: Secondary | ICD-10-CM | POA: Diagnosis not present

## 2017-12-23 DIAGNOSIS — M25561 Pain in right knee: Secondary | ICD-10-CM | POA: Diagnosis not present

## 2017-12-23 DIAGNOSIS — Z96651 Presence of right artificial knee joint: Secondary | ICD-10-CM | POA: Diagnosis not present

## 2017-12-23 DIAGNOSIS — M6281 Muscle weakness (generalized): Secondary | ICD-10-CM | POA: Diagnosis not present

## 2017-12-23 DIAGNOSIS — R262 Difficulty in walking, not elsewhere classified: Secondary | ICD-10-CM | POA: Diagnosis not present

## 2017-12-23 DIAGNOSIS — Z471 Aftercare following joint replacement surgery: Secondary | ICD-10-CM | POA: Diagnosis not present

## 2017-12-24 DIAGNOSIS — M25561 Pain in right knee: Secondary | ICD-10-CM | POA: Diagnosis not present

## 2017-12-24 DIAGNOSIS — R262 Difficulty in walking, not elsewhere classified: Secondary | ICD-10-CM | POA: Diagnosis not present

## 2017-12-24 DIAGNOSIS — M6281 Muscle weakness (generalized): Secondary | ICD-10-CM | POA: Diagnosis not present

## 2017-12-24 DIAGNOSIS — Z96651 Presence of right artificial knee joint: Secondary | ICD-10-CM | POA: Diagnosis not present

## 2017-12-24 DIAGNOSIS — Z471 Aftercare following joint replacement surgery: Secondary | ICD-10-CM | POA: Diagnosis not present

## 2017-12-25 DIAGNOSIS — Z471 Aftercare following joint replacement surgery: Secondary | ICD-10-CM | POA: Diagnosis not present

## 2017-12-25 DIAGNOSIS — Z96651 Presence of right artificial knee joint: Secondary | ICD-10-CM | POA: Diagnosis not present

## 2017-12-27 DIAGNOSIS — Z6826 Body mass index (BMI) 26.0-26.9, adult: Secondary | ICD-10-CM | POA: Diagnosis not present

## 2017-12-27 DIAGNOSIS — R110 Nausea: Secondary | ICD-10-CM | POA: Diagnosis not present

## 2017-12-27 DIAGNOSIS — Z2821 Immunization not carried out because of patient refusal: Secondary | ICD-10-CM | POA: Diagnosis not present

## 2017-12-27 DIAGNOSIS — R630 Anorexia: Secondary | ICD-10-CM | POA: Diagnosis not present

## 2017-12-27 DIAGNOSIS — I10 Essential (primary) hypertension: Secondary | ICD-10-CM | POA: Diagnosis not present

## 2017-12-27 DIAGNOSIS — Z299 Encounter for prophylactic measures, unspecified: Secondary | ICD-10-CM | POA: Diagnosis not present

## 2017-12-31 DIAGNOSIS — R262 Difficulty in walking, not elsewhere classified: Secondary | ICD-10-CM | POA: Diagnosis not present

## 2017-12-31 DIAGNOSIS — M25561 Pain in right knee: Secondary | ICD-10-CM | POA: Diagnosis not present

## 2017-12-31 DIAGNOSIS — Z471 Aftercare following joint replacement surgery: Secondary | ICD-10-CM | POA: Diagnosis not present

## 2017-12-31 DIAGNOSIS — M6281 Muscle weakness (generalized): Secondary | ICD-10-CM | POA: Diagnosis not present

## 2017-12-31 DIAGNOSIS — Z96651 Presence of right artificial knee joint: Secondary | ICD-10-CM | POA: Diagnosis not present

## 2018-01-02 DIAGNOSIS — M25561 Pain in right knee: Secondary | ICD-10-CM | POA: Diagnosis not present

## 2018-01-02 DIAGNOSIS — Z96651 Presence of right artificial knee joint: Secondary | ICD-10-CM | POA: Diagnosis not present

## 2018-01-02 DIAGNOSIS — R262 Difficulty in walking, not elsewhere classified: Secondary | ICD-10-CM | POA: Diagnosis not present

## 2018-01-02 DIAGNOSIS — M6281 Muscle weakness (generalized): Secondary | ICD-10-CM | POA: Diagnosis not present

## 2018-01-02 DIAGNOSIS — Z471 Aftercare following joint replacement surgery: Secondary | ICD-10-CM | POA: Diagnosis not present

## 2018-01-03 DIAGNOSIS — M6281 Muscle weakness (generalized): Secondary | ICD-10-CM | POA: Diagnosis not present

## 2018-01-03 DIAGNOSIS — Z96651 Presence of right artificial knee joint: Secondary | ICD-10-CM | POA: Diagnosis not present

## 2018-01-03 DIAGNOSIS — M25561 Pain in right knee: Secondary | ICD-10-CM | POA: Diagnosis not present

## 2018-01-03 DIAGNOSIS — Z471 Aftercare following joint replacement surgery: Secondary | ICD-10-CM | POA: Diagnosis not present

## 2018-01-03 DIAGNOSIS — R262 Difficulty in walking, not elsewhere classified: Secondary | ICD-10-CM | POA: Diagnosis not present

## 2018-01-05 DIAGNOSIS — I2699 Other pulmonary embolism without acute cor pulmonale: Secondary | ICD-10-CM | POA: Diagnosis not present

## 2018-01-05 DIAGNOSIS — R0902 Hypoxemia: Secondary | ICD-10-CM | POA: Diagnosis not present

## 2018-01-05 DIAGNOSIS — R799 Abnormal finding of blood chemistry, unspecified: Secondary | ICD-10-CM | POA: Diagnosis not present

## 2018-01-05 DIAGNOSIS — R0789 Other chest pain: Secondary | ICD-10-CM | POA: Diagnosis not present

## 2018-01-05 DIAGNOSIS — Z9889 Other specified postprocedural states: Secondary | ICD-10-CM | POA: Diagnosis not present

## 2018-01-05 DIAGNOSIS — R61 Generalized hyperhidrosis: Secondary | ICD-10-CM | POA: Diagnosis not present

## 2018-01-05 DIAGNOSIS — R1084 Generalized abdominal pain: Secondary | ICD-10-CM | POA: Diagnosis not present

## 2018-01-05 DIAGNOSIS — J90 Pleural effusion, not elsewhere classified: Secondary | ICD-10-CM | POA: Diagnosis not present

## 2018-01-05 DIAGNOSIS — I959 Hypotension, unspecified: Secondary | ICD-10-CM | POA: Diagnosis not present

## 2018-01-05 DIAGNOSIS — I10 Essential (primary) hypertension: Secondary | ICD-10-CM | POA: Diagnosis not present

## 2018-01-05 DIAGNOSIS — Z888 Allergy status to other drugs, medicaments and biological substances status: Secondary | ICD-10-CM | POA: Diagnosis not present

## 2018-01-05 DIAGNOSIS — R0600 Dyspnea, unspecified: Secondary | ICD-10-CM | POA: Diagnosis not present

## 2018-01-05 DIAGNOSIS — I2602 Saddle embolus of pulmonary artery with acute cor pulmonale: Secondary | ICD-10-CM | POA: Diagnosis not present

## 2018-01-05 DIAGNOSIS — R079 Chest pain, unspecified: Secondary | ICD-10-CM | POA: Diagnosis not present

## 2018-01-05 DIAGNOSIS — R05 Cough: Secondary | ICD-10-CM | POA: Diagnosis not present

## 2018-01-05 DIAGNOSIS — Z79899 Other long term (current) drug therapy: Secondary | ICD-10-CM | POA: Diagnosis not present

## 2018-01-05 DIAGNOSIS — R531 Weakness: Secondary | ICD-10-CM | POA: Diagnosis not present

## 2018-01-06 DIAGNOSIS — I82401 Acute embolism and thrombosis of unspecified deep veins of right lower extremity: Secondary | ICD-10-CM | POA: Diagnosis not present

## 2018-01-06 DIAGNOSIS — R05 Cough: Secondary | ICD-10-CM | POA: Diagnosis not present

## 2018-01-06 DIAGNOSIS — F329 Major depressive disorder, single episode, unspecified: Secondary | ICD-10-CM | POA: Diagnosis present

## 2018-01-06 DIAGNOSIS — I82441 Acute embolism and thrombosis of right tibial vein: Secondary | ICD-10-CM | POA: Diagnosis present

## 2018-01-06 DIAGNOSIS — E669 Obesity, unspecified: Secondary | ICD-10-CM | POA: Diagnosis present

## 2018-01-06 DIAGNOSIS — I82451 Acute embolism and thrombosis of right peroneal vein: Secondary | ICD-10-CM | POA: Diagnosis present

## 2018-01-06 DIAGNOSIS — Z9049 Acquired absence of other specified parts of digestive tract: Secondary | ICD-10-CM | POA: Diagnosis not present

## 2018-01-06 DIAGNOSIS — Z888 Allergy status to other drugs, medicaments and biological substances status: Secondary | ICD-10-CM | POA: Diagnosis not present

## 2018-01-06 DIAGNOSIS — E876 Hypokalemia: Secondary | ICD-10-CM | POA: Diagnosis present

## 2018-01-06 DIAGNOSIS — R079 Chest pain, unspecified: Secondary | ICD-10-CM | POA: Diagnosis not present

## 2018-01-06 DIAGNOSIS — Z86718 Personal history of other venous thrombosis and embolism: Secondary | ICD-10-CM | POA: Diagnosis not present

## 2018-01-06 DIAGNOSIS — K589 Irritable bowel syndrome without diarrhea: Secondary | ICD-10-CM | POA: Diagnosis present

## 2018-01-06 DIAGNOSIS — I10 Essential (primary) hypertension: Secondary | ICD-10-CM | POA: Diagnosis present

## 2018-01-06 DIAGNOSIS — I82431 Acute embolism and thrombosis of right popliteal vein: Secondary | ICD-10-CM | POA: Diagnosis present

## 2018-01-06 DIAGNOSIS — F418 Other specified anxiety disorders: Secondary | ICD-10-CM | POA: Diagnosis not present

## 2018-01-06 DIAGNOSIS — F419 Anxiety disorder, unspecified: Secondary | ICD-10-CM | POA: Diagnosis present

## 2018-01-06 DIAGNOSIS — Z90710 Acquired absence of both cervix and uterus: Secondary | ICD-10-CM | POA: Diagnosis not present

## 2018-01-06 DIAGNOSIS — I2692 Saddle embolus of pulmonary artery without acute cor pulmonale: Secondary | ICD-10-CM | POA: Diagnosis present

## 2018-01-06 DIAGNOSIS — Z6834 Body mass index (BMI) 34.0-34.9, adult: Secondary | ICD-10-CM | POA: Diagnosis not present

## 2018-01-06 DIAGNOSIS — Z452 Encounter for adjustment and management of vascular access device: Secondary | ICD-10-CM | POA: Diagnosis not present

## 2018-01-06 DIAGNOSIS — Z96651 Presence of right artificial knee joint: Secondary | ICD-10-CM | POA: Diagnosis present

## 2018-01-06 DIAGNOSIS — J9601 Acute respiratory failure with hypoxia: Secondary | ICD-10-CM | POA: Diagnosis present

## 2018-01-06 DIAGNOSIS — Z881 Allergy status to other antibiotic agents status: Secondary | ICD-10-CM | POA: Diagnosis not present

## 2018-01-06 DIAGNOSIS — Z883 Allergy status to other anti-infective agents status: Secondary | ICD-10-CM | POA: Diagnosis not present

## 2018-01-06 DIAGNOSIS — K219 Gastro-esophageal reflux disease without esophagitis: Secondary | ICD-10-CM | POA: Diagnosis present

## 2018-01-06 DIAGNOSIS — I2699 Other pulmonary embolism without acute cor pulmonale: Secondary | ICD-10-CM | POA: Diagnosis not present

## 2018-01-12 DIAGNOSIS — Z5181 Encounter for therapeutic drug level monitoring: Secondary | ICD-10-CM | POA: Diagnosis not present

## 2018-01-12 DIAGNOSIS — F329 Major depressive disorder, single episode, unspecified: Secondary | ICD-10-CM | POA: Diagnosis not present

## 2018-01-12 DIAGNOSIS — I2699 Other pulmonary embolism without acute cor pulmonale: Secondary | ICD-10-CM | POA: Diagnosis not present

## 2018-01-12 DIAGNOSIS — I2690 Septic pulmonary embolism without acute cor pulmonale: Secondary | ICD-10-CM | POA: Diagnosis not present

## 2018-01-12 DIAGNOSIS — Z7901 Long term (current) use of anticoagulants: Secondary | ICD-10-CM | POA: Diagnosis not present

## 2018-01-12 DIAGNOSIS — Z96651 Presence of right artificial knee joint: Secondary | ICD-10-CM | POA: Diagnosis not present

## 2018-01-12 DIAGNOSIS — I10 Essential (primary) hypertension: Secondary | ICD-10-CM | POA: Diagnosis not present

## 2018-01-13 DIAGNOSIS — Z299 Encounter for prophylactic measures, unspecified: Secondary | ICD-10-CM | POA: Diagnosis not present

## 2018-01-13 DIAGNOSIS — K219 Gastro-esophageal reflux disease without esophagitis: Secondary | ICD-10-CM | POA: Diagnosis not present

## 2018-01-13 DIAGNOSIS — I2699 Other pulmonary embolism without acute cor pulmonale: Secondary | ICD-10-CM | POA: Diagnosis not present

## 2018-01-13 DIAGNOSIS — Z6835 Body mass index (BMI) 35.0-35.9, adult: Secondary | ICD-10-CM | POA: Diagnosis not present

## 2018-01-13 DIAGNOSIS — I10 Essential (primary) hypertension: Secondary | ICD-10-CM | POA: Diagnosis not present

## 2018-01-14 DIAGNOSIS — F329 Major depressive disorder, single episode, unspecified: Secondary | ICD-10-CM | POA: Diagnosis not present

## 2018-01-14 DIAGNOSIS — I10 Essential (primary) hypertension: Secondary | ICD-10-CM | POA: Diagnosis not present

## 2018-01-14 DIAGNOSIS — Z7901 Long term (current) use of anticoagulants: Secondary | ICD-10-CM | POA: Diagnosis not present

## 2018-01-14 DIAGNOSIS — Z96651 Presence of right artificial knee joint: Secondary | ICD-10-CM | POA: Diagnosis not present

## 2018-01-14 DIAGNOSIS — Z5181 Encounter for therapeutic drug level monitoring: Secondary | ICD-10-CM | POA: Diagnosis not present

## 2018-01-14 DIAGNOSIS — I2699 Other pulmonary embolism without acute cor pulmonale: Secondary | ICD-10-CM | POA: Diagnosis not present

## 2018-01-16 DIAGNOSIS — Z5181 Encounter for therapeutic drug level monitoring: Secondary | ICD-10-CM | POA: Diagnosis not present

## 2018-01-16 DIAGNOSIS — I2699 Other pulmonary embolism without acute cor pulmonale: Secondary | ICD-10-CM | POA: Diagnosis not present

## 2018-01-16 DIAGNOSIS — I10 Essential (primary) hypertension: Secondary | ICD-10-CM | POA: Diagnosis not present

## 2018-01-16 DIAGNOSIS — Z96651 Presence of right artificial knee joint: Secondary | ICD-10-CM | POA: Diagnosis not present

## 2018-01-16 DIAGNOSIS — F329 Major depressive disorder, single episode, unspecified: Secondary | ICD-10-CM | POA: Diagnosis not present

## 2018-01-16 DIAGNOSIS — Z7901 Long term (current) use of anticoagulants: Secondary | ICD-10-CM | POA: Diagnosis not present

## 2018-01-20 DIAGNOSIS — I2699 Other pulmonary embolism without acute cor pulmonale: Secondary | ICD-10-CM | POA: Diagnosis not present

## 2018-01-20 DIAGNOSIS — Z5181 Encounter for therapeutic drug level monitoring: Secondary | ICD-10-CM | POA: Diagnosis not present

## 2018-01-20 DIAGNOSIS — Z96651 Presence of right artificial knee joint: Secondary | ICD-10-CM | POA: Diagnosis not present

## 2018-01-20 DIAGNOSIS — Z7901 Long term (current) use of anticoagulants: Secondary | ICD-10-CM | POA: Diagnosis not present

## 2018-01-20 DIAGNOSIS — F329 Major depressive disorder, single episode, unspecified: Secondary | ICD-10-CM | POA: Diagnosis not present

## 2018-01-20 DIAGNOSIS — I10 Essential (primary) hypertension: Secondary | ICD-10-CM | POA: Diagnosis not present

## 2018-01-24 DIAGNOSIS — Z471 Aftercare following joint replacement surgery: Secondary | ICD-10-CM | POA: Diagnosis not present

## 2018-01-24 DIAGNOSIS — Z96651 Presence of right artificial knee joint: Secondary | ICD-10-CM | POA: Diagnosis not present

## 2018-01-27 DIAGNOSIS — I10 Essential (primary) hypertension: Secondary | ICD-10-CM | POA: Diagnosis not present

## 2018-01-27 DIAGNOSIS — Z299 Encounter for prophylactic measures, unspecified: Secondary | ICD-10-CM | POA: Diagnosis not present

## 2018-01-27 DIAGNOSIS — I2699 Other pulmonary embolism without acute cor pulmonale: Secondary | ICD-10-CM | POA: Diagnosis not present

## 2018-01-27 DIAGNOSIS — Z6835 Body mass index (BMI) 35.0-35.9, adult: Secondary | ICD-10-CM | POA: Diagnosis not present

## 2018-01-30 DIAGNOSIS — Z7901 Long term (current) use of anticoagulants: Secondary | ICD-10-CM | POA: Diagnosis not present

## 2018-01-30 DIAGNOSIS — F329 Major depressive disorder, single episode, unspecified: Secondary | ICD-10-CM | POA: Diagnosis not present

## 2018-01-30 DIAGNOSIS — Z96651 Presence of right artificial knee joint: Secondary | ICD-10-CM | POA: Diagnosis not present

## 2018-01-30 DIAGNOSIS — Z5181 Encounter for therapeutic drug level monitoring: Secondary | ICD-10-CM | POA: Diagnosis not present

## 2018-01-30 DIAGNOSIS — I2699 Other pulmonary embolism without acute cor pulmonale: Secondary | ICD-10-CM | POA: Diagnosis not present

## 2018-01-30 DIAGNOSIS — I10 Essential (primary) hypertension: Secondary | ICD-10-CM | POA: Diagnosis not present

## 2018-02-03 DIAGNOSIS — Z86718 Personal history of other venous thrombosis and embolism: Secondary | ICD-10-CM | POA: Diagnosis not present

## 2018-02-03 DIAGNOSIS — R0609 Other forms of dyspnea: Secondary | ICD-10-CM | POA: Diagnosis not present

## 2018-02-03 DIAGNOSIS — R5383 Other fatigue: Secondary | ICD-10-CM | POA: Diagnosis not present

## 2018-02-03 DIAGNOSIS — E785 Hyperlipidemia, unspecified: Secondary | ICD-10-CM | POA: Diagnosis not present

## 2018-02-03 DIAGNOSIS — K219 Gastro-esophageal reflux disease without esophagitis: Secondary | ICD-10-CM | POA: Diagnosis not present

## 2018-02-03 DIAGNOSIS — I82409 Acute embolism and thrombosis of unspecified deep veins of unspecified lower extremity: Secondary | ICD-10-CM | POA: Diagnosis not present

## 2018-02-03 DIAGNOSIS — I10 Essential (primary) hypertension: Secondary | ICD-10-CM | POA: Diagnosis not present

## 2018-02-03 DIAGNOSIS — Z86711 Personal history of pulmonary embolism: Secondary | ICD-10-CM | POA: Diagnosis not present

## 2018-02-04 DIAGNOSIS — Z96651 Presence of right artificial knee joint: Secondary | ICD-10-CM | POA: Diagnosis not present

## 2018-02-04 DIAGNOSIS — Z5181 Encounter for therapeutic drug level monitoring: Secondary | ICD-10-CM | POA: Diagnosis not present

## 2018-02-04 DIAGNOSIS — I10 Essential (primary) hypertension: Secondary | ICD-10-CM | POA: Diagnosis not present

## 2018-02-04 DIAGNOSIS — I2699 Other pulmonary embolism without acute cor pulmonale: Secondary | ICD-10-CM | POA: Diagnosis not present

## 2018-02-04 DIAGNOSIS — Z7901 Long term (current) use of anticoagulants: Secondary | ICD-10-CM | POA: Diagnosis not present

## 2018-02-04 DIAGNOSIS — F329 Major depressive disorder, single episode, unspecified: Secondary | ICD-10-CM | POA: Diagnosis not present

## 2018-02-05 DIAGNOSIS — I10 Essential (primary) hypertension: Secondary | ICD-10-CM | POA: Diagnosis not present

## 2018-02-05 DIAGNOSIS — R0609 Other forms of dyspnea: Secondary | ICD-10-CM | POA: Diagnosis not present

## 2018-02-05 DIAGNOSIS — I82409 Acute embolism and thrombosis of unspecified deep veins of unspecified lower extremity: Secondary | ICD-10-CM | POA: Diagnosis not present

## 2018-02-05 DIAGNOSIS — I2699 Other pulmonary embolism without acute cor pulmonale: Secondary | ICD-10-CM | POA: Diagnosis not present

## 2018-02-07 DIAGNOSIS — R0609 Other forms of dyspnea: Secondary | ICD-10-CM | POA: Diagnosis not present

## 2018-02-07 DIAGNOSIS — M25519 Pain in unspecified shoulder: Secondary | ICD-10-CM | POA: Diagnosis not present

## 2018-02-11 DIAGNOSIS — M549 Dorsalgia, unspecified: Secondary | ICD-10-CM | POA: Diagnosis not present

## 2018-02-11 DIAGNOSIS — I2699 Other pulmonary embolism without acute cor pulmonale: Secondary | ICD-10-CM | POA: Diagnosis not present

## 2018-02-11 DIAGNOSIS — Z6835 Body mass index (BMI) 35.0-35.9, adult: Secondary | ICD-10-CM | POA: Diagnosis not present

## 2018-02-11 DIAGNOSIS — Z299 Encounter for prophylactic measures, unspecified: Secondary | ICD-10-CM | POA: Diagnosis not present

## 2018-02-11 DIAGNOSIS — E785 Hyperlipidemia, unspecified: Secondary | ICD-10-CM | POA: Diagnosis not present

## 2018-02-11 DIAGNOSIS — Z789 Other specified health status: Secondary | ICD-10-CM | POA: Diagnosis not present

## 2018-02-11 DIAGNOSIS — I10 Essential (primary) hypertension: Secondary | ICD-10-CM | POA: Diagnosis not present

## 2018-02-12 DIAGNOSIS — Z96651 Presence of right artificial knee joint: Secondary | ICD-10-CM | POA: Diagnosis not present

## 2018-02-12 DIAGNOSIS — Z471 Aftercare following joint replacement surgery: Secondary | ICD-10-CM | POA: Diagnosis not present

## 2018-02-13 DIAGNOSIS — Z96651 Presence of right artificial knee joint: Secondary | ICD-10-CM | POA: Diagnosis not present

## 2018-02-13 DIAGNOSIS — Z7901 Long term (current) use of anticoagulants: Secondary | ICD-10-CM | POA: Diagnosis not present

## 2018-02-13 DIAGNOSIS — I10 Essential (primary) hypertension: Secondary | ICD-10-CM | POA: Diagnosis not present

## 2018-02-13 DIAGNOSIS — Z5181 Encounter for therapeutic drug level monitoring: Secondary | ICD-10-CM | POA: Diagnosis not present

## 2018-02-13 DIAGNOSIS — F329 Major depressive disorder, single episode, unspecified: Secondary | ICD-10-CM | POA: Diagnosis not present

## 2018-02-13 DIAGNOSIS — I2699 Other pulmonary embolism without acute cor pulmonale: Secondary | ICD-10-CM | POA: Diagnosis not present

## 2018-02-14 DIAGNOSIS — R0609 Other forms of dyspnea: Secondary | ICD-10-CM | POA: Diagnosis not present

## 2018-02-14 DIAGNOSIS — I10 Essential (primary) hypertension: Secondary | ICD-10-CM | POA: Diagnosis not present

## 2018-02-14 DIAGNOSIS — K219 Gastro-esophageal reflux disease without esophagitis: Secondary | ICD-10-CM | POA: Diagnosis not present

## 2018-02-14 DIAGNOSIS — Z86711 Personal history of pulmonary embolism: Secondary | ICD-10-CM | POA: Diagnosis not present

## 2018-02-14 DIAGNOSIS — E785 Hyperlipidemia, unspecified: Secondary | ICD-10-CM | POA: Diagnosis not present

## 2018-02-14 DIAGNOSIS — R9439 Abnormal result of other cardiovascular function study: Secondary | ICD-10-CM | POA: Diagnosis not present

## 2018-02-14 DIAGNOSIS — I82409 Acute embolism and thrombosis of unspecified deep veins of unspecified lower extremity: Secondary | ICD-10-CM | POA: Diagnosis not present

## 2018-03-04 DIAGNOSIS — R262 Difficulty in walking, not elsewhere classified: Secondary | ICD-10-CM | POA: Diagnosis not present

## 2018-03-04 DIAGNOSIS — M6281 Muscle weakness (generalized): Secondary | ICD-10-CM | POA: Diagnosis not present

## 2018-03-04 DIAGNOSIS — Z96651 Presence of right artificial knee joint: Secondary | ICD-10-CM | POA: Diagnosis not present

## 2018-03-04 DIAGNOSIS — M25561 Pain in right knee: Secondary | ICD-10-CM | POA: Diagnosis not present

## 2018-03-05 DIAGNOSIS — Z96651 Presence of right artificial knee joint: Secondary | ICD-10-CM | POA: Diagnosis not present

## 2018-03-05 DIAGNOSIS — M25561 Pain in right knee: Secondary | ICD-10-CM | POA: Diagnosis not present

## 2018-03-05 DIAGNOSIS — R262 Difficulty in walking, not elsewhere classified: Secondary | ICD-10-CM | POA: Diagnosis not present

## 2018-03-05 DIAGNOSIS — M6281 Muscle weakness (generalized): Secondary | ICD-10-CM | POA: Diagnosis not present

## 2018-03-06 DIAGNOSIS — M6281 Muscle weakness (generalized): Secondary | ICD-10-CM | POA: Diagnosis not present

## 2018-03-06 DIAGNOSIS — M25561 Pain in right knee: Secondary | ICD-10-CM | POA: Diagnosis not present

## 2018-03-06 DIAGNOSIS — Z96651 Presence of right artificial knee joint: Secondary | ICD-10-CM | POA: Diagnosis not present

## 2018-03-06 DIAGNOSIS — R262 Difficulty in walking, not elsewhere classified: Secondary | ICD-10-CM | POA: Diagnosis not present

## 2018-03-10 DIAGNOSIS — R262 Difficulty in walking, not elsewhere classified: Secondary | ICD-10-CM | POA: Diagnosis not present

## 2018-03-10 DIAGNOSIS — Z96651 Presence of right artificial knee joint: Secondary | ICD-10-CM | POA: Diagnosis not present

## 2018-03-10 DIAGNOSIS — M25561 Pain in right knee: Secondary | ICD-10-CM | POA: Diagnosis not present

## 2018-03-10 DIAGNOSIS — M6281 Muscle weakness (generalized): Secondary | ICD-10-CM | POA: Diagnosis not present

## 2018-03-11 DIAGNOSIS — Z299 Encounter for prophylactic measures, unspecified: Secondary | ICD-10-CM | POA: Diagnosis not present

## 2018-03-11 DIAGNOSIS — R42 Dizziness and giddiness: Secondary | ICD-10-CM | POA: Diagnosis not present

## 2018-03-11 DIAGNOSIS — I2699 Other pulmonary embolism without acute cor pulmonale: Secondary | ICD-10-CM | POA: Diagnosis not present

## 2018-03-11 DIAGNOSIS — I10 Essential (primary) hypertension: Secondary | ICD-10-CM | POA: Diagnosis not present

## 2018-03-11 DIAGNOSIS — Z6835 Body mass index (BMI) 35.0-35.9, adult: Secondary | ICD-10-CM | POA: Diagnosis not present

## 2018-03-12 DIAGNOSIS — R262 Difficulty in walking, not elsewhere classified: Secondary | ICD-10-CM | POA: Diagnosis not present

## 2018-03-12 DIAGNOSIS — M25561 Pain in right knee: Secondary | ICD-10-CM | POA: Diagnosis not present

## 2018-03-12 DIAGNOSIS — Z96651 Presence of right artificial knee joint: Secondary | ICD-10-CM | POA: Diagnosis not present

## 2018-03-12 DIAGNOSIS — M6281 Muscle weakness (generalized): Secondary | ICD-10-CM | POA: Diagnosis not present

## 2018-03-14 DIAGNOSIS — M25561 Pain in right knee: Secondary | ICD-10-CM | POA: Diagnosis not present

## 2018-03-14 DIAGNOSIS — M6281 Muscle weakness (generalized): Secondary | ICD-10-CM | POA: Diagnosis not present

## 2018-03-14 DIAGNOSIS — Z96651 Presence of right artificial knee joint: Secondary | ICD-10-CM | POA: Diagnosis not present

## 2018-03-14 DIAGNOSIS — R262 Difficulty in walking, not elsewhere classified: Secondary | ICD-10-CM | POA: Diagnosis not present

## 2018-03-18 DIAGNOSIS — M6281 Muscle weakness (generalized): Secondary | ICD-10-CM | POA: Diagnosis not present

## 2018-03-18 DIAGNOSIS — R262 Difficulty in walking, not elsewhere classified: Secondary | ICD-10-CM | POA: Diagnosis not present

## 2018-03-18 DIAGNOSIS — Z96651 Presence of right artificial knee joint: Secondary | ICD-10-CM | POA: Diagnosis not present

## 2018-03-18 DIAGNOSIS — M25561 Pain in right knee: Secondary | ICD-10-CM | POA: Diagnosis not present

## 2018-03-19 DIAGNOSIS — R262 Difficulty in walking, not elsewhere classified: Secondary | ICD-10-CM | POA: Diagnosis not present

## 2018-03-19 DIAGNOSIS — Z96651 Presence of right artificial knee joint: Secondary | ICD-10-CM | POA: Diagnosis not present

## 2018-03-19 DIAGNOSIS — M6281 Muscle weakness (generalized): Secondary | ICD-10-CM | POA: Diagnosis not present

## 2018-03-19 DIAGNOSIS — M25561 Pain in right knee: Secondary | ICD-10-CM | POA: Diagnosis not present

## 2018-03-20 DIAGNOSIS — M25561 Pain in right knee: Secondary | ICD-10-CM | POA: Diagnosis not present

## 2018-03-20 DIAGNOSIS — R262 Difficulty in walking, not elsewhere classified: Secondary | ICD-10-CM | POA: Diagnosis not present

## 2018-03-20 DIAGNOSIS — Z96651 Presence of right artificial knee joint: Secondary | ICD-10-CM | POA: Diagnosis not present

## 2018-03-20 DIAGNOSIS — M6281 Muscle weakness (generalized): Secondary | ICD-10-CM | POA: Diagnosis not present

## 2018-03-24 DIAGNOSIS — M25561 Pain in right knee: Secondary | ICD-10-CM | POA: Diagnosis not present

## 2018-03-24 DIAGNOSIS — R262 Difficulty in walking, not elsewhere classified: Secondary | ICD-10-CM | POA: Diagnosis not present

## 2018-03-24 DIAGNOSIS — M6281 Muscle weakness (generalized): Secondary | ICD-10-CM | POA: Diagnosis not present

## 2018-03-24 DIAGNOSIS — Z96651 Presence of right artificial knee joint: Secondary | ICD-10-CM | POA: Diagnosis not present

## 2018-03-25 DIAGNOSIS — H81393 Other peripheral vertigo, bilateral: Secondary | ICD-10-CM | POA: Diagnosis not present

## 2018-03-25 DIAGNOSIS — G9009 Other idiopathic peripheral autonomic neuropathy: Secondary | ICD-10-CM | POA: Diagnosis not present

## 2018-03-25 DIAGNOSIS — I7389 Other specified peripheral vascular diseases: Secondary | ICD-10-CM | POA: Diagnosis not present

## 2018-03-26 DIAGNOSIS — M6281 Muscle weakness (generalized): Secondary | ICD-10-CM | POA: Diagnosis not present

## 2018-03-26 DIAGNOSIS — R262 Difficulty in walking, not elsewhere classified: Secondary | ICD-10-CM | POA: Diagnosis not present

## 2018-03-26 DIAGNOSIS — M25561 Pain in right knee: Secondary | ICD-10-CM | POA: Diagnosis not present

## 2018-03-26 DIAGNOSIS — Z96651 Presence of right artificial knee joint: Secondary | ICD-10-CM | POA: Diagnosis not present

## 2018-03-27 DIAGNOSIS — Z96651 Presence of right artificial knee joint: Secondary | ICD-10-CM | POA: Diagnosis not present

## 2018-03-27 DIAGNOSIS — M25561 Pain in right knee: Secondary | ICD-10-CM | POA: Diagnosis not present

## 2018-03-27 DIAGNOSIS — R262 Difficulty in walking, not elsewhere classified: Secondary | ICD-10-CM | POA: Diagnosis not present

## 2018-03-27 DIAGNOSIS — M6281 Muscle weakness (generalized): Secondary | ICD-10-CM | POA: Diagnosis not present

## 2018-03-28 DIAGNOSIS — I10 Essential (primary) hypertension: Secondary | ICD-10-CM | POA: Diagnosis not present

## 2018-03-28 DIAGNOSIS — Z299 Encounter for prophylactic measures, unspecified: Secondary | ICD-10-CM | POA: Diagnosis not present

## 2018-03-28 DIAGNOSIS — Z6835 Body mass index (BMI) 35.0-35.9, adult: Secondary | ICD-10-CM | POA: Diagnosis not present

## 2018-03-28 DIAGNOSIS — I2699 Other pulmonary embolism without acute cor pulmonale: Secondary | ICD-10-CM | POA: Diagnosis not present

## 2018-04-01 DIAGNOSIS — M6281 Muscle weakness (generalized): Secondary | ICD-10-CM | POA: Diagnosis not present

## 2018-04-01 DIAGNOSIS — Z96651 Presence of right artificial knee joint: Secondary | ICD-10-CM | POA: Diagnosis not present

## 2018-04-01 DIAGNOSIS — M25561 Pain in right knee: Secondary | ICD-10-CM | POA: Diagnosis not present

## 2018-04-01 DIAGNOSIS — R262 Difficulty in walking, not elsewhere classified: Secondary | ICD-10-CM | POA: Diagnosis not present

## 2018-04-03 DIAGNOSIS — Z96651 Presence of right artificial knee joint: Secondary | ICD-10-CM | POA: Diagnosis not present

## 2018-04-03 DIAGNOSIS — M6281 Muscle weakness (generalized): Secondary | ICD-10-CM | POA: Diagnosis not present

## 2018-04-03 DIAGNOSIS — M25561 Pain in right knee: Secondary | ICD-10-CM | POA: Diagnosis not present

## 2018-04-03 DIAGNOSIS — R262 Difficulty in walking, not elsewhere classified: Secondary | ICD-10-CM | POA: Diagnosis not present

## 2018-04-08 DIAGNOSIS — I10 Essential (primary) hypertension: Secondary | ICD-10-CM | POA: Diagnosis not present

## 2018-04-08 DIAGNOSIS — Z299 Encounter for prophylactic measures, unspecified: Secondary | ICD-10-CM | POA: Diagnosis not present

## 2018-04-08 DIAGNOSIS — M25561 Pain in right knee: Secondary | ICD-10-CM | POA: Diagnosis not present

## 2018-04-08 DIAGNOSIS — F419 Anxiety disorder, unspecified: Secondary | ICD-10-CM | POA: Diagnosis not present

## 2018-04-08 DIAGNOSIS — I2699 Other pulmonary embolism without acute cor pulmonale: Secondary | ICD-10-CM | POA: Diagnosis not present

## 2018-04-08 DIAGNOSIS — Z6835 Body mass index (BMI) 35.0-35.9, adult: Secondary | ICD-10-CM | POA: Diagnosis not present

## 2018-04-08 DIAGNOSIS — Z96651 Presence of right artificial knee joint: Secondary | ICD-10-CM | POA: Diagnosis not present

## 2018-04-08 DIAGNOSIS — R262 Difficulty in walking, not elsewhere classified: Secondary | ICD-10-CM | POA: Diagnosis not present

## 2018-04-08 DIAGNOSIS — H01019 Ulcerative blepharitis unspecified eye, unspecified eyelid: Secondary | ICD-10-CM | POA: Diagnosis not present

## 2018-04-08 DIAGNOSIS — R42 Dizziness and giddiness: Secondary | ICD-10-CM | POA: Diagnosis not present

## 2018-04-08 DIAGNOSIS — M6281 Muscle weakness (generalized): Secondary | ICD-10-CM | POA: Diagnosis not present

## 2018-04-10 DIAGNOSIS — M25561 Pain in right knee: Secondary | ICD-10-CM | POA: Diagnosis not present

## 2018-04-10 DIAGNOSIS — M6281 Muscle weakness (generalized): Secondary | ICD-10-CM | POA: Diagnosis not present

## 2018-04-10 DIAGNOSIS — R262 Difficulty in walking, not elsewhere classified: Secondary | ICD-10-CM | POA: Diagnosis not present

## 2018-04-10 DIAGNOSIS — Z96651 Presence of right artificial knee joint: Secondary | ICD-10-CM | POA: Diagnosis not present

## 2018-04-15 DIAGNOSIS — Z96651 Presence of right artificial knee joint: Secondary | ICD-10-CM | POA: Diagnosis not present

## 2018-04-15 DIAGNOSIS — R262 Difficulty in walking, not elsewhere classified: Secondary | ICD-10-CM | POA: Diagnosis not present

## 2018-04-15 DIAGNOSIS — M25561 Pain in right knee: Secondary | ICD-10-CM | POA: Diagnosis not present

## 2018-04-15 DIAGNOSIS — M6281 Muscle weakness (generalized): Secondary | ICD-10-CM | POA: Diagnosis not present

## 2018-04-17 DIAGNOSIS — Z96651 Presence of right artificial knee joint: Secondary | ICD-10-CM | POA: Diagnosis not present

## 2018-04-17 DIAGNOSIS — M6281 Muscle weakness (generalized): Secondary | ICD-10-CM | POA: Diagnosis not present

## 2018-04-17 DIAGNOSIS — M25561 Pain in right knee: Secondary | ICD-10-CM | POA: Diagnosis not present

## 2018-04-17 DIAGNOSIS — R262 Difficulty in walking, not elsewhere classified: Secondary | ICD-10-CM | POA: Diagnosis not present

## 2018-04-22 DIAGNOSIS — I2699 Other pulmonary embolism without acute cor pulmonale: Secondary | ICD-10-CM | POA: Diagnosis not present

## 2018-04-22 DIAGNOSIS — Z6835 Body mass index (BMI) 35.0-35.9, adult: Secondary | ICD-10-CM | POA: Diagnosis not present

## 2018-04-22 DIAGNOSIS — Z299 Encounter for prophylactic measures, unspecified: Secondary | ICD-10-CM | POA: Diagnosis not present

## 2018-04-22 DIAGNOSIS — I10 Essential (primary) hypertension: Secondary | ICD-10-CM | POA: Diagnosis not present

## 2018-04-22 DIAGNOSIS — F419 Anxiety disorder, unspecified: Secondary | ICD-10-CM | POA: Diagnosis not present

## 2018-05-12 DIAGNOSIS — I10 Essential (primary) hypertension: Secondary | ICD-10-CM | POA: Diagnosis not present

## 2018-05-12 DIAGNOSIS — Z299 Encounter for prophylactic measures, unspecified: Secondary | ICD-10-CM | POA: Diagnosis not present

## 2018-05-12 DIAGNOSIS — I2699 Other pulmonary embolism without acute cor pulmonale: Secondary | ICD-10-CM | POA: Diagnosis not present

## 2018-05-12 DIAGNOSIS — Z6835 Body mass index (BMI) 35.0-35.9, adult: Secondary | ICD-10-CM | POA: Diagnosis not present

## 2018-05-12 DIAGNOSIS — J029 Acute pharyngitis, unspecified: Secondary | ICD-10-CM | POA: Diagnosis not present

## 2018-05-12 DIAGNOSIS — R609 Edema, unspecified: Secondary | ICD-10-CM | POA: Diagnosis not present

## 2018-05-20 DIAGNOSIS — R9439 Abnormal result of other cardiovascular function study: Secondary | ICD-10-CM | POA: Diagnosis not present

## 2018-05-20 DIAGNOSIS — R0609 Other forms of dyspnea: Secondary | ICD-10-CM | POA: Diagnosis not present

## 2018-05-20 DIAGNOSIS — Z7901 Long term (current) use of anticoagulants: Secondary | ICD-10-CM | POA: Diagnosis not present

## 2018-05-20 DIAGNOSIS — I82409 Acute embolism and thrombosis of unspecified deep veins of unspecified lower extremity: Secondary | ICD-10-CM | POA: Diagnosis not present

## 2018-05-20 DIAGNOSIS — I10 Essential (primary) hypertension: Secondary | ICD-10-CM | POA: Diagnosis not present

## 2018-05-20 DIAGNOSIS — E785 Hyperlipidemia, unspecified: Secondary | ICD-10-CM | POA: Diagnosis not present

## 2018-05-20 DIAGNOSIS — Z86711 Personal history of pulmonary embolism: Secondary | ICD-10-CM | POA: Diagnosis not present

## 2018-05-27 DIAGNOSIS — I2699 Other pulmonary embolism without acute cor pulmonale: Secondary | ICD-10-CM | POA: Diagnosis not present

## 2018-05-27 DIAGNOSIS — Z6836 Body mass index (BMI) 36.0-36.9, adult: Secondary | ICD-10-CM | POA: Diagnosis not present

## 2018-05-27 DIAGNOSIS — Z713 Dietary counseling and surveillance: Secondary | ICD-10-CM | POA: Diagnosis not present

## 2018-05-27 DIAGNOSIS — Z299 Encounter for prophylactic measures, unspecified: Secondary | ICD-10-CM | POA: Diagnosis not present

## 2018-05-27 DIAGNOSIS — I10 Essential (primary) hypertension: Secondary | ICD-10-CM | POA: Diagnosis not present

## 2018-06-10 DIAGNOSIS — I10 Essential (primary) hypertension: Secondary | ICD-10-CM | POA: Diagnosis not present

## 2018-06-10 DIAGNOSIS — Z6836 Body mass index (BMI) 36.0-36.9, adult: Secondary | ICD-10-CM | POA: Diagnosis not present

## 2018-06-10 DIAGNOSIS — Z713 Dietary counseling and surveillance: Secondary | ICD-10-CM | POA: Diagnosis not present

## 2018-06-10 DIAGNOSIS — Z299 Encounter for prophylactic measures, unspecified: Secondary | ICD-10-CM | POA: Diagnosis not present

## 2018-06-10 DIAGNOSIS — I2699 Other pulmonary embolism without acute cor pulmonale: Secondary | ICD-10-CM | POA: Diagnosis not present

## 2018-06-11 DIAGNOSIS — I82409 Acute embolism and thrombosis of unspecified deep veins of unspecified lower extremity: Secondary | ICD-10-CM | POA: Diagnosis not present

## 2018-06-11 DIAGNOSIS — M79604 Pain in right leg: Secondary | ICD-10-CM | POA: Diagnosis not present

## 2018-06-16 DIAGNOSIS — Z1331 Encounter for screening for depression: Secondary | ICD-10-CM | POA: Diagnosis not present

## 2018-06-16 DIAGNOSIS — E785 Hyperlipidemia, unspecified: Secondary | ICD-10-CM | POA: Diagnosis not present

## 2018-06-16 DIAGNOSIS — Z79899 Other long term (current) drug therapy: Secondary | ICD-10-CM | POA: Diagnosis not present

## 2018-06-16 DIAGNOSIS — F419 Anxiety disorder, unspecified: Secondary | ICD-10-CM | POA: Diagnosis not present

## 2018-06-16 DIAGNOSIS — Z1211 Encounter for screening for malignant neoplasm of colon: Secondary | ICD-10-CM | POA: Diagnosis not present

## 2018-06-16 DIAGNOSIS — Z299 Encounter for prophylactic measures, unspecified: Secondary | ICD-10-CM | POA: Diagnosis not present

## 2018-06-16 DIAGNOSIS — E559 Vitamin D deficiency, unspecified: Secondary | ICD-10-CM | POA: Diagnosis not present

## 2018-06-16 DIAGNOSIS — R5383 Other fatigue: Secondary | ICD-10-CM | POA: Diagnosis not present

## 2018-06-16 DIAGNOSIS — Z1339 Encounter for screening examination for other mental health and behavioral disorders: Secondary | ICD-10-CM | POA: Diagnosis not present

## 2018-06-16 DIAGNOSIS — Z7189 Other specified counseling: Secondary | ICD-10-CM | POA: Diagnosis not present

## 2018-06-16 DIAGNOSIS — Z0000 Encounter for general adult medical examination without abnormal findings: Secondary | ICD-10-CM | POA: Diagnosis not present

## 2018-06-16 DIAGNOSIS — Z6836 Body mass index (BMI) 36.0-36.9, adult: Secondary | ICD-10-CM | POA: Diagnosis not present

## 2018-06-20 DIAGNOSIS — D72829 Elevated white blood cell count, unspecified: Secondary | ICD-10-CM | POA: Diagnosis not present

## 2018-06-20 DIAGNOSIS — I2699 Other pulmonary embolism without acute cor pulmonale: Secondary | ICD-10-CM | POA: Diagnosis not present

## 2018-06-20 DIAGNOSIS — Z789 Other specified health status: Secondary | ICD-10-CM | POA: Diagnosis not present

## 2018-06-20 DIAGNOSIS — Z299 Encounter for prophylactic measures, unspecified: Secondary | ICD-10-CM | POA: Diagnosis not present

## 2018-07-08 DIAGNOSIS — D72829 Elevated white blood cell count, unspecified: Secondary | ICD-10-CM | POA: Diagnosis not present

## 2018-07-09 DIAGNOSIS — H524 Presbyopia: Secondary | ICD-10-CM | POA: Diagnosis not present

## 2018-07-09 DIAGNOSIS — H5212 Myopia, left eye: Secondary | ICD-10-CM | POA: Diagnosis not present

## 2018-07-09 DIAGNOSIS — H25813 Combined forms of age-related cataract, bilateral: Secondary | ICD-10-CM | POA: Diagnosis not present

## 2018-07-09 DIAGNOSIS — H52221 Regular astigmatism, right eye: Secondary | ICD-10-CM | POA: Diagnosis not present

## 2018-07-09 DIAGNOSIS — H5201 Hypermetropia, right eye: Secondary | ICD-10-CM | POA: Diagnosis not present

## 2018-08-21 DIAGNOSIS — I2699 Other pulmonary embolism without acute cor pulmonale: Secondary | ICD-10-CM | POA: Diagnosis not present

## 2018-08-21 DIAGNOSIS — M7520 Bicipital tendinitis, unspecified shoulder: Secondary | ICD-10-CM | POA: Diagnosis not present

## 2018-08-21 DIAGNOSIS — Z299 Encounter for prophylactic measures, unspecified: Secondary | ICD-10-CM | POA: Diagnosis not present

## 2018-08-21 DIAGNOSIS — I10 Essential (primary) hypertension: Secondary | ICD-10-CM | POA: Diagnosis not present

## 2018-08-21 DIAGNOSIS — Z6836 Body mass index (BMI) 36.0-36.9, adult: Secondary | ICD-10-CM | POA: Diagnosis not present

## 2018-09-10 DIAGNOSIS — H2511 Age-related nuclear cataract, right eye: Secondary | ICD-10-CM | POA: Diagnosis not present

## 2018-09-10 DIAGNOSIS — H2513 Age-related nuclear cataract, bilateral: Secondary | ICD-10-CM | POA: Diagnosis not present

## 2018-09-18 DIAGNOSIS — Z1231 Encounter for screening mammogram for malignant neoplasm of breast: Secondary | ICD-10-CM | POA: Diagnosis not present

## 2018-09-29 DIAGNOSIS — H2511 Age-related nuclear cataract, right eye: Secondary | ICD-10-CM | POA: Diagnosis not present

## 2018-10-14 DIAGNOSIS — H2512 Age-related nuclear cataract, left eye: Secondary | ICD-10-CM | POA: Diagnosis not present

## 2018-10-15 DIAGNOSIS — H2512 Age-related nuclear cataract, left eye: Secondary | ICD-10-CM | POA: Diagnosis not present

## 2018-10-17 IMAGING — CT CT ABD-PELV W/ CM
2 of 5 series · 16 of 46 positions shown, 18 images · IV contrast (Isovue)
Comparison: None.

CLINICAL DATA: Lower abdominal pain for 2.5 months

EXAM:
CT ABDOMEN AND PELVIS WITH CONTRAST
TECHNIQUE: Multidetector CT imaging of the abdomen and pelvis was performed
using the standard protocol following bolus administration of
intravenous contrast. Oral contrast was also administered.
CONTRAST:  100mL 3CTB8N-0DD IOPAMIDOL (3CTB8N-0DD) INJECTION 61%

[Series 2: axial st · axial · 0.81mm/px · z∈[+1183,+1583]mm · 13 of 91 slices shown, 15 images]
[im 6/91  soft-tissue]
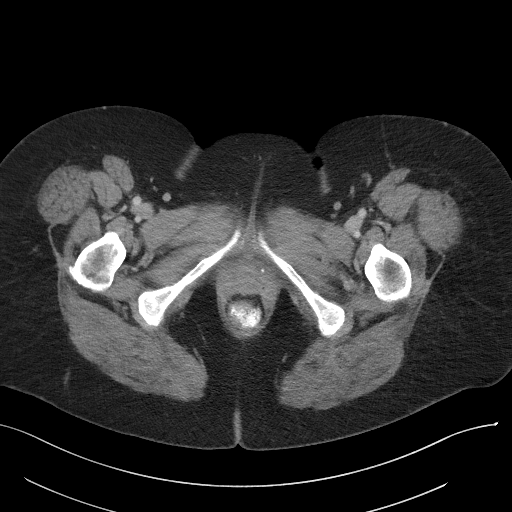
[im 6/91  bone]
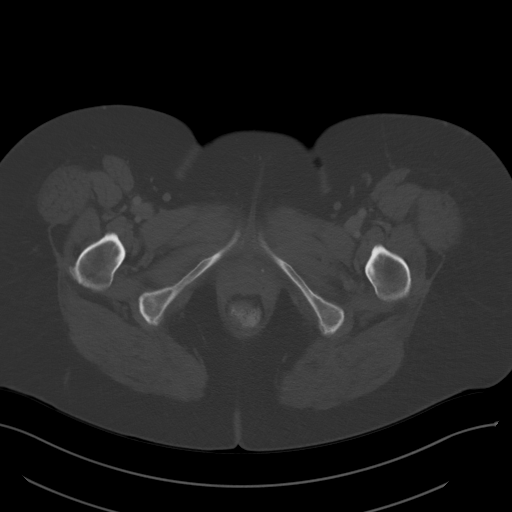
[im 11/91  soft-tissue]
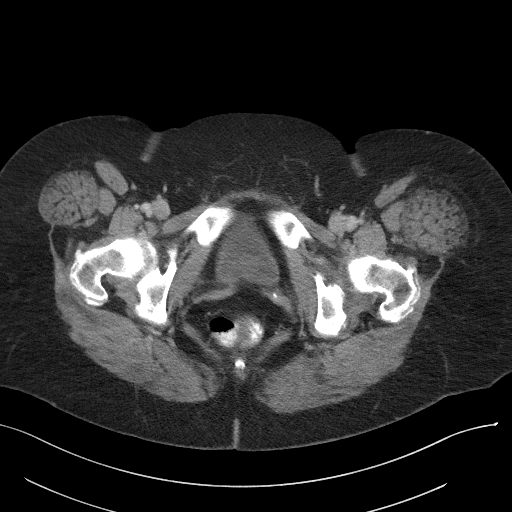
[im 21/91  soft-tissue]
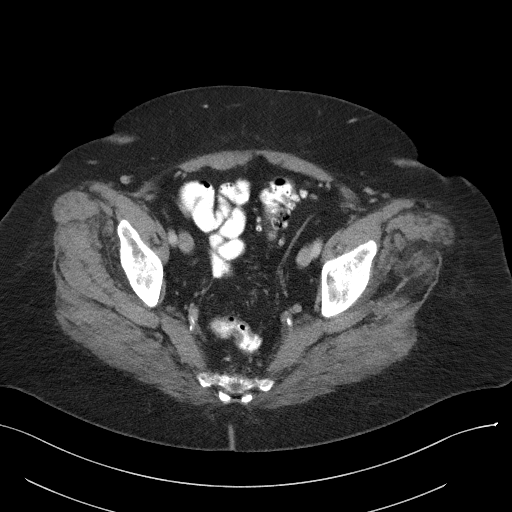
[im 26/91  soft-tissue]
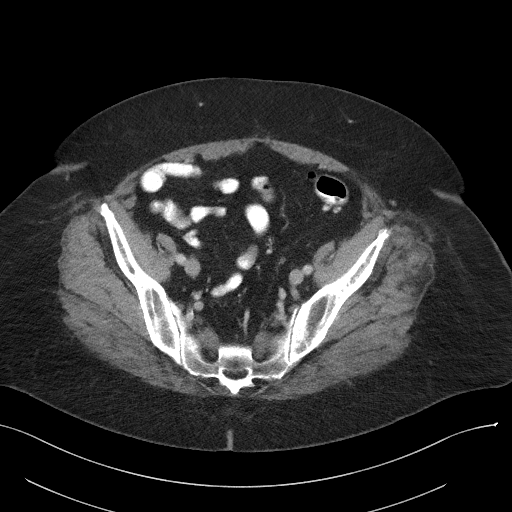
[im 31/91  soft-tissue]
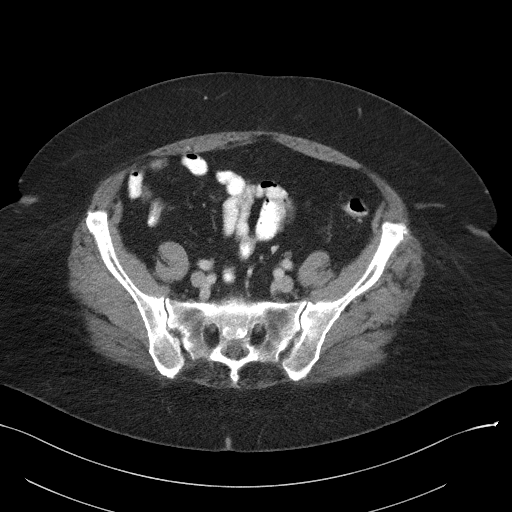
[im 41/91  soft-tissue]
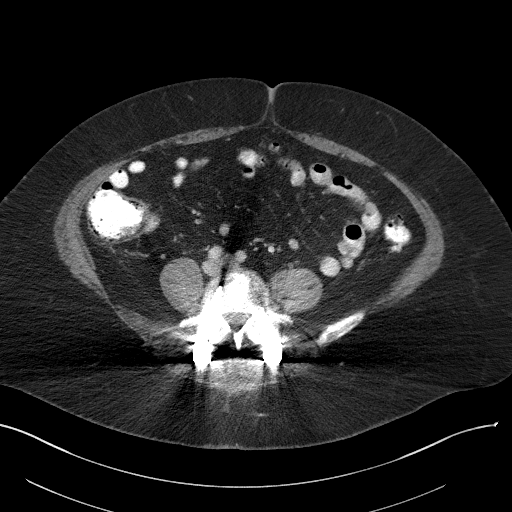
[im 46/91  soft-tissue]
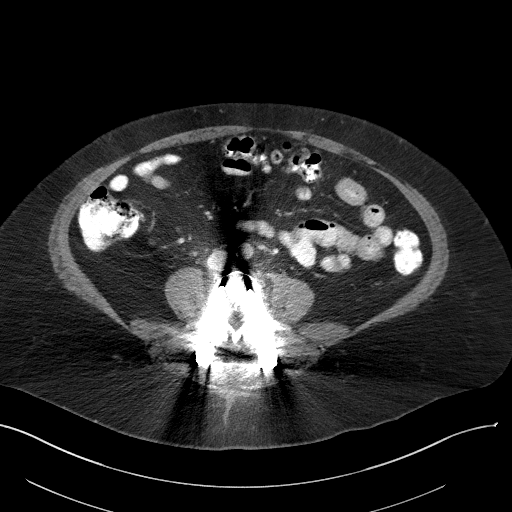
[im 51/91  soft-tissue]
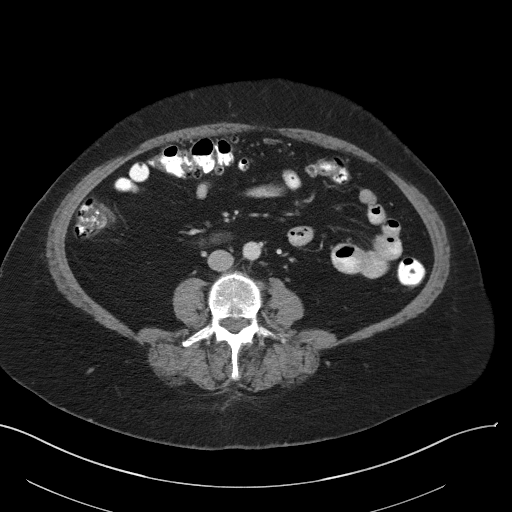
[im 61/91  soft-tissue]
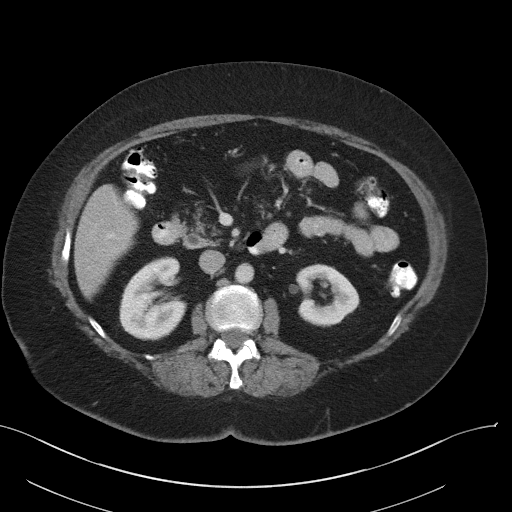
[im 61/91  bone]
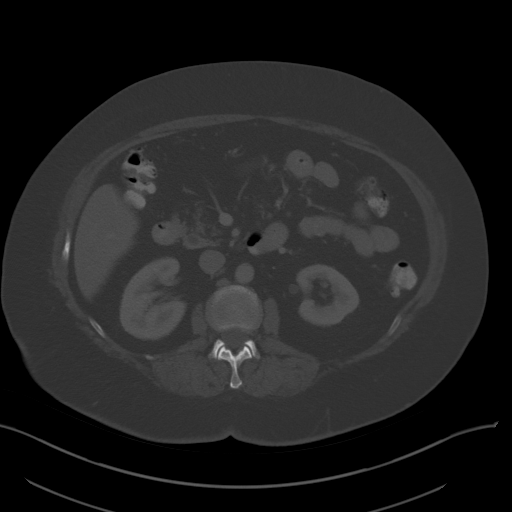
[im 66/91  soft-tissue]
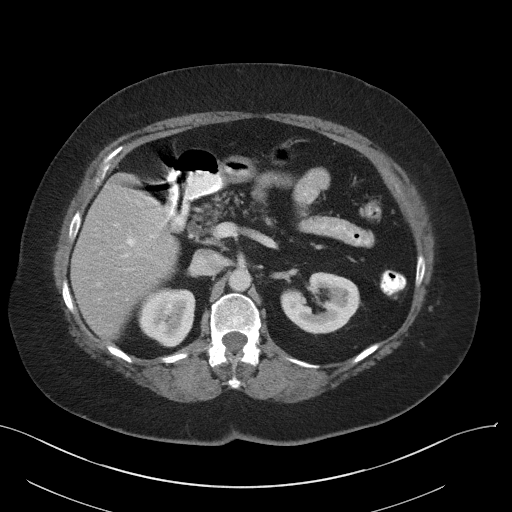
[im 71/91  soft-tissue]
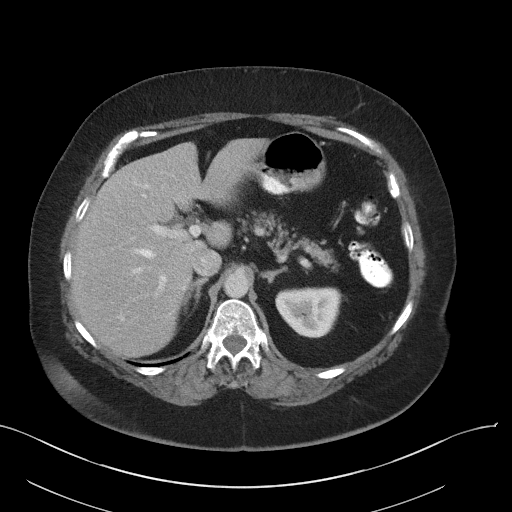
[im 81/91  soft-tissue]
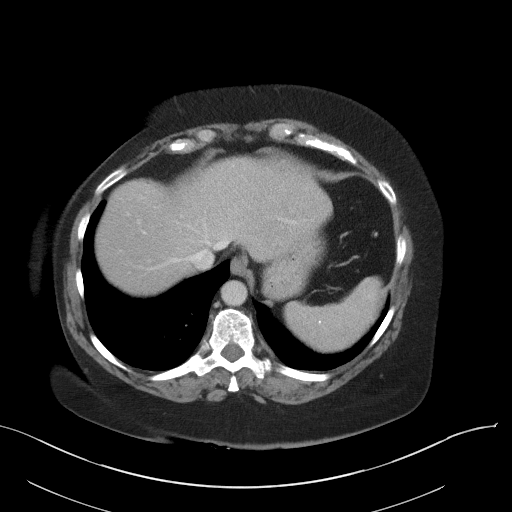
[im 86/91  soft-tissue]
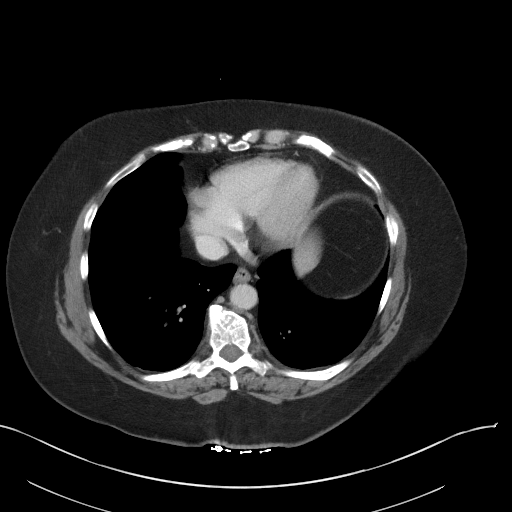

[Series 6: coronal st · coronal · 0.79mm/px · 3 of 117 slices shown]
[im 39/117  soft-tissue]
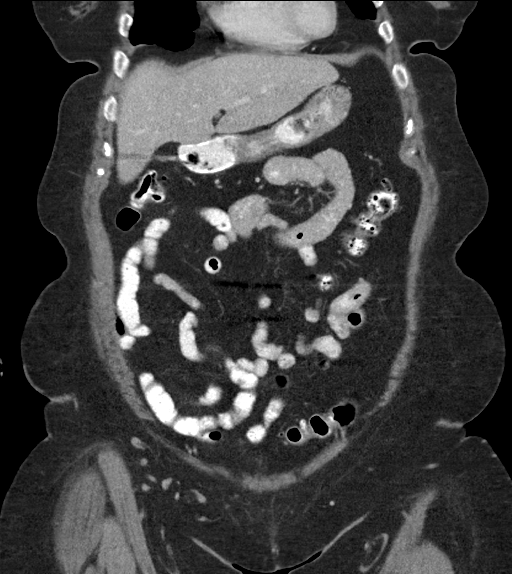
[im 52/117  soft-tissue]
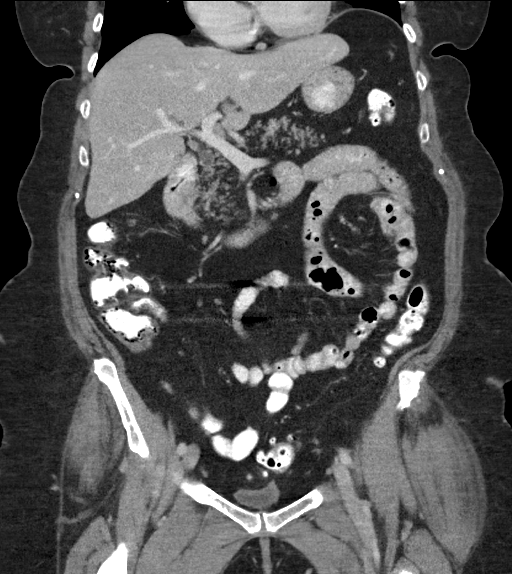
[im 65/117  soft-tissue]
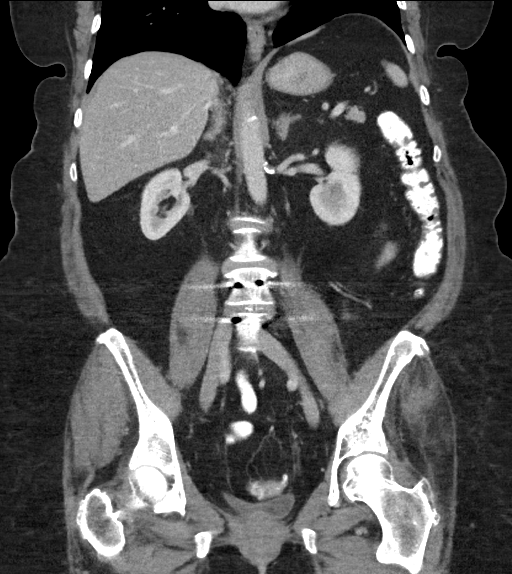

[16 of 46 positions shown; findings below may reference images not displayed]

FINDINGS: Lower chest: Lung bases are clear.

Hepatobiliary: No focal liver lesions are appreciable on this
noncontrast enhanced study. Gallbladder is absent. There is no
appreciable biliary duct dilatation.

Pancreas: There is fatty infiltration throughout portions of the
pancreas. No pancreatic mass or inflammatory focus.

Spleen: No splenic lesions are evident beyond a tiny granuloma in
the posterior spleen.

Adrenals/Urinary Tract: Adrenals appear unremarkable bilaterally.
There is no evident renal mass or hydronephrosis on either side.
There is no renal or ureteral calculus on either side. Urinary
bladder is midline with wall thickness within normal limits for
degree of distention.

Stomach/Bowel: There are multiple sigmoid and descending colonic
diverticulum without diverticulitis. There are more scattered
diverticulum throughout colon elsewhere. There is no bowel wall or
mesenteric thickening. There is no appreciable bowel obstruction. No
free air or portal venous air. There is lipomatous infiltration in
the ileocecal valve.

Vascular/Lymphatic: There is atherosclerotic calcification in the
aorta. No aneurysm. Major mesenteric vessels appear patent. There is
moderate calcification at the origin of the left renal artery. There
is no appreciable adenopathy in the abdomen or pelvis.

Reproductive: Uterus absent.  No pelvic mass.

Other: There is no periappendiceal region inflammation. There is no
abscess or ascites in the abdomen or pelvis. There is a small
ventral hernia containing only fat.

Musculoskeletal: There is fatty replacement of portions of the
gluteus maximus muscle on the left laterally. There is postoperative
change at L4 and L5 with posterior screw and plate fixation at these
levels. There are areas of degenerative change in the lumbar spine.
There are no blastic or lytic bone lesions. There is no abdominal
wall lesion.
IMPRESSION: Gallbladder absent.

Multiple colonic diverticula without diverticulitis. No bowel
obstruction. No abscess. No periappendiceal region inflammation.

No renal or ureteral calculus.  No hydronephrosis.

Aortic atherosclerosis.

Postoperative change in the lumbar spine. Atrophy with fatty
infiltration in the lateral aspect of the left gluteus maximus
muscle.

Small ventral hernia containing only fat.

## 2018-11-19 DIAGNOSIS — I10 Essential (primary) hypertension: Secondary | ICD-10-CM | POA: Diagnosis not present

## 2018-11-19 DIAGNOSIS — R9439 Abnormal result of other cardiovascular function study: Secondary | ICD-10-CM | POA: Diagnosis not present

## 2018-11-19 DIAGNOSIS — Z01810 Encounter for preprocedural cardiovascular examination: Secondary | ICD-10-CM | POA: Diagnosis not present

## 2018-11-19 DIAGNOSIS — E785 Hyperlipidemia, unspecified: Secondary | ICD-10-CM | POA: Diagnosis not present

## 2018-11-19 DIAGNOSIS — Z86711 Personal history of pulmonary embolism: Secondary | ICD-10-CM | POA: Diagnosis not present

## 2018-11-19 DIAGNOSIS — I82409 Acute embolism and thrombosis of unspecified deep veins of unspecified lower extremity: Secondary | ICD-10-CM | POA: Diagnosis not present

## 2018-11-19 DIAGNOSIS — K219 Gastro-esophageal reflux disease without esophagitis: Secondary | ICD-10-CM | POA: Diagnosis not present

## 2018-11-19 DIAGNOSIS — R0609 Other forms of dyspnea: Secondary | ICD-10-CM | POA: Diagnosis not present

## 2018-11-19 DIAGNOSIS — Z7901 Long term (current) use of anticoagulants: Secondary | ICD-10-CM | POA: Diagnosis not present

## 2018-11-26 DIAGNOSIS — Z96651 Presence of right artificial knee joint: Secondary | ICD-10-CM | POA: Diagnosis not present

## 2018-11-26 DIAGNOSIS — Z471 Aftercare following joint replacement surgery: Secondary | ICD-10-CM | POA: Diagnosis not present

## 2018-12-01 DIAGNOSIS — Z7982 Long term (current) use of aspirin: Secondary | ICD-10-CM | POA: Diagnosis not present

## 2018-12-01 DIAGNOSIS — K219 Gastro-esophageal reflux disease without esophagitis: Secondary | ICD-10-CM | POA: Diagnosis not present

## 2018-12-01 DIAGNOSIS — I10 Essential (primary) hypertension: Secondary | ICD-10-CM | POA: Diagnosis not present

## 2018-12-01 DIAGNOSIS — F329 Major depressive disorder, single episode, unspecified: Secondary | ICD-10-CM | POA: Diagnosis not present

## 2018-12-01 DIAGNOSIS — I2510 Atherosclerotic heart disease of native coronary artery without angina pectoris: Secondary | ICD-10-CM | POA: Diagnosis not present

## 2018-12-01 DIAGNOSIS — E876 Hypokalemia: Secondary | ICD-10-CM | POA: Diagnosis not present

## 2018-12-01 DIAGNOSIS — F418 Other specified anxiety disorders: Secondary | ICD-10-CM | POA: Diagnosis not present

## 2018-12-01 DIAGNOSIS — R0789 Other chest pain: Secondary | ICD-10-CM | POA: Diagnosis not present

## 2018-12-01 DIAGNOSIS — F419 Anxiety disorder, unspecified: Secondary | ICD-10-CM | POA: Diagnosis not present

## 2018-12-01 DIAGNOSIS — R9439 Abnormal result of other cardiovascular function study: Secondary | ICD-10-CM | POA: Diagnosis not present

## 2018-12-01 DIAGNOSIS — K589 Irritable bowel syndrome without diarrhea: Secondary | ICD-10-CM | POA: Diagnosis not present

## 2018-12-02 DIAGNOSIS — R9431 Abnormal electrocardiogram [ECG] [EKG]: Secondary | ICD-10-CM | POA: Diagnosis not present

## 2018-12-02 DIAGNOSIS — E876 Hypokalemia: Secondary | ICD-10-CM | POA: Diagnosis not present

## 2018-12-02 DIAGNOSIS — R079 Chest pain, unspecified: Secondary | ICD-10-CM | POA: Diagnosis not present

## 2018-12-02 DIAGNOSIS — I10 Essential (primary) hypertension: Secondary | ICD-10-CM | POA: Diagnosis not present

## 2018-12-02 DIAGNOSIS — F419 Anxiety disorder, unspecified: Secondary | ICD-10-CM | POA: Diagnosis not present

## 2018-12-02 DIAGNOSIS — K589 Irritable bowel syndrome without diarrhea: Secondary | ICD-10-CM | POA: Diagnosis not present

## 2018-12-02 DIAGNOSIS — K219 Gastro-esophageal reflux disease without esophagitis: Secondary | ICD-10-CM | POA: Diagnosis not present

## 2018-12-02 DIAGNOSIS — F329 Major depressive disorder, single episode, unspecified: Secondary | ICD-10-CM | POA: Diagnosis not present

## 2018-12-02 DIAGNOSIS — I2510 Atherosclerotic heart disease of native coronary artery without angina pectoris: Secondary | ICD-10-CM | POA: Diagnosis not present

## 2018-12-16 DIAGNOSIS — Z6837 Body mass index (BMI) 37.0-37.9, adult: Secondary | ICD-10-CM | POA: Diagnosis not present

## 2018-12-16 DIAGNOSIS — I10 Essential (primary) hypertension: Secondary | ICD-10-CM | POA: Diagnosis not present

## 2018-12-16 DIAGNOSIS — Z299 Encounter for prophylactic measures, unspecified: Secondary | ICD-10-CM | POA: Diagnosis not present

## 2018-12-16 DIAGNOSIS — I2510 Atherosclerotic heart disease of native coronary artery without angina pectoris: Secondary | ICD-10-CM | POA: Diagnosis not present

## 2018-12-16 DIAGNOSIS — I2699 Other pulmonary embolism without acute cor pulmonale: Secondary | ICD-10-CM | POA: Diagnosis not present

## 2019-01-01 DIAGNOSIS — I2510 Atherosclerotic heart disease of native coronary artery without angina pectoris: Secondary | ICD-10-CM | POA: Diagnosis not present

## 2019-02-05 DIAGNOSIS — H26492 Other secondary cataract, left eye: Secondary | ICD-10-CM | POA: Diagnosis not present

## 2019-02-17 DIAGNOSIS — F418 Other specified anxiety disorders: Secondary | ICD-10-CM | POA: Diagnosis not present

## 2019-02-17 DIAGNOSIS — I2699 Other pulmonary embolism without acute cor pulmonale: Secondary | ICD-10-CM | POA: Diagnosis not present

## 2019-02-17 DIAGNOSIS — I10 Essential (primary) hypertension: Secondary | ICD-10-CM | POA: Diagnosis not present

## 2019-02-17 DIAGNOSIS — I2510 Atherosclerotic heart disease of native coronary artery without angina pectoris: Secondary | ICD-10-CM | POA: Diagnosis not present

## 2019-02-17 DIAGNOSIS — Z299 Encounter for prophylactic measures, unspecified: Secondary | ICD-10-CM | POA: Diagnosis not present

## 2019-02-17 DIAGNOSIS — Z789 Other specified health status: Secondary | ICD-10-CM | POA: Diagnosis not present

## 2019-02-17 DIAGNOSIS — Z6839 Body mass index (BMI) 39.0-39.9, adult: Secondary | ICD-10-CM | POA: Diagnosis not present

## 2019-02-24 DIAGNOSIS — H26492 Other secondary cataract, left eye: Secondary | ICD-10-CM | POA: Diagnosis not present

## 2019-03-03 DIAGNOSIS — F418 Other specified anxiety disorders: Secondary | ICD-10-CM | POA: Diagnosis not present

## 2019-03-03 DIAGNOSIS — K589 Irritable bowel syndrome without diarrhea: Secondary | ICD-10-CM | POA: Diagnosis not present

## 2019-03-03 DIAGNOSIS — Z79899 Other long term (current) drug therapy: Secondary | ICD-10-CM | POA: Diagnosis not present

## 2019-03-03 DIAGNOSIS — Z888 Allergy status to other drugs, medicaments and biological substances status: Secondary | ICD-10-CM | POA: Diagnosis not present

## 2019-03-03 DIAGNOSIS — Z7982 Long term (current) use of aspirin: Secondary | ICD-10-CM | POA: Diagnosis not present

## 2019-03-03 DIAGNOSIS — Z791 Long term (current) use of non-steroidal anti-inflammatories (NSAID): Secondary | ICD-10-CM | POA: Diagnosis not present

## 2019-03-03 DIAGNOSIS — E785 Hyperlipidemia, unspecified: Secondary | ICD-10-CM | POA: Diagnosis not present

## 2019-03-03 DIAGNOSIS — I10 Essential (primary) hypertension: Secondary | ICD-10-CM | POA: Diagnosis not present

## 2019-03-03 DIAGNOSIS — Z955 Presence of coronary angioplasty implant and graft: Secondary | ICD-10-CM | POA: Diagnosis not present

## 2019-03-03 DIAGNOSIS — I2510 Atherosclerotic heart disease of native coronary artery without angina pectoris: Secondary | ICD-10-CM | POA: Diagnosis not present

## 2019-03-03 DIAGNOSIS — Z881 Allergy status to other antibiotic agents status: Secondary | ICD-10-CM | POA: Diagnosis not present

## 2019-03-03 DIAGNOSIS — K219 Gastro-esophageal reflux disease without esophagitis: Secondary | ICD-10-CM | POA: Diagnosis not present

## 2019-03-03 DIAGNOSIS — M13861 Other specified arthritis, right knee: Secondary | ICD-10-CM | POA: Diagnosis not present

## 2019-03-05 DIAGNOSIS — K219 Gastro-esophageal reflux disease without esophagitis: Secondary | ICD-10-CM | POA: Diagnosis not present

## 2019-03-05 DIAGNOSIS — I2510 Atherosclerotic heart disease of native coronary artery without angina pectoris: Secondary | ICD-10-CM | POA: Diagnosis not present

## 2019-03-05 DIAGNOSIS — K589 Irritable bowel syndrome without diarrhea: Secondary | ICD-10-CM | POA: Diagnosis not present

## 2019-03-05 DIAGNOSIS — F418 Other specified anxiety disorders: Secondary | ICD-10-CM | POA: Diagnosis not present

## 2019-03-05 DIAGNOSIS — I10 Essential (primary) hypertension: Secondary | ICD-10-CM | POA: Diagnosis not present

## 2019-03-05 DIAGNOSIS — Z955 Presence of coronary angioplasty implant and graft: Secondary | ICD-10-CM | POA: Diagnosis not present

## 2019-03-10 DIAGNOSIS — I10 Essential (primary) hypertension: Secondary | ICD-10-CM | POA: Diagnosis not present

## 2019-03-10 DIAGNOSIS — I2510 Atherosclerotic heart disease of native coronary artery without angina pectoris: Secondary | ICD-10-CM | POA: Diagnosis not present

## 2019-03-10 DIAGNOSIS — K219 Gastro-esophageal reflux disease without esophagitis: Secondary | ICD-10-CM | POA: Diagnosis not present

## 2019-03-10 DIAGNOSIS — Z955 Presence of coronary angioplasty implant and graft: Secondary | ICD-10-CM | POA: Diagnosis not present

## 2019-03-10 DIAGNOSIS — F418 Other specified anxiety disorders: Secondary | ICD-10-CM | POA: Diagnosis not present

## 2019-03-10 DIAGNOSIS — K589 Irritable bowel syndrome without diarrhea: Secondary | ICD-10-CM | POA: Diagnosis not present

## 2019-03-12 DIAGNOSIS — Z23 Encounter for immunization: Secondary | ICD-10-CM | POA: Diagnosis not present

## 2019-03-12 DIAGNOSIS — F418 Other specified anxiety disorders: Secondary | ICD-10-CM | POA: Diagnosis not present

## 2019-03-12 DIAGNOSIS — I10 Essential (primary) hypertension: Secondary | ICD-10-CM | POA: Diagnosis not present

## 2019-03-12 DIAGNOSIS — K219 Gastro-esophageal reflux disease without esophagitis: Secondary | ICD-10-CM | POA: Diagnosis not present

## 2019-03-12 DIAGNOSIS — K589 Irritable bowel syndrome without diarrhea: Secondary | ICD-10-CM | POA: Diagnosis not present

## 2019-03-12 DIAGNOSIS — Z955 Presence of coronary angioplasty implant and graft: Secondary | ICD-10-CM | POA: Diagnosis not present

## 2019-03-12 DIAGNOSIS — I2510 Atherosclerotic heart disease of native coronary artery without angina pectoris: Secondary | ICD-10-CM | POA: Diagnosis not present

## 2019-03-17 DIAGNOSIS — K219 Gastro-esophageal reflux disease without esophagitis: Secondary | ICD-10-CM | POA: Diagnosis not present

## 2019-03-17 DIAGNOSIS — Z955 Presence of coronary angioplasty implant and graft: Secondary | ICD-10-CM | POA: Diagnosis not present

## 2019-03-17 DIAGNOSIS — I2510 Atherosclerotic heart disease of native coronary artery without angina pectoris: Secondary | ICD-10-CM | POA: Diagnosis not present

## 2019-03-17 DIAGNOSIS — I10 Essential (primary) hypertension: Secondary | ICD-10-CM | POA: Diagnosis not present

## 2019-03-17 DIAGNOSIS — K589 Irritable bowel syndrome without diarrhea: Secondary | ICD-10-CM | POA: Diagnosis not present

## 2019-03-17 DIAGNOSIS — F418 Other specified anxiety disorders: Secondary | ICD-10-CM | POA: Diagnosis not present

## 2019-03-23 DIAGNOSIS — Z955 Presence of coronary angioplasty implant and graft: Secondary | ICD-10-CM | POA: Diagnosis not present

## 2019-03-23 DIAGNOSIS — I10 Essential (primary) hypertension: Secondary | ICD-10-CM | POA: Diagnosis not present

## 2019-03-23 DIAGNOSIS — I2510 Atherosclerotic heart disease of native coronary artery without angina pectoris: Secondary | ICD-10-CM | POA: Diagnosis not present

## 2019-03-23 DIAGNOSIS — F418 Other specified anxiety disorders: Secondary | ICD-10-CM | POA: Diagnosis not present

## 2019-03-23 DIAGNOSIS — K589 Irritable bowel syndrome without diarrhea: Secondary | ICD-10-CM | POA: Diagnosis not present

## 2019-03-23 DIAGNOSIS — K219 Gastro-esophageal reflux disease without esophagitis: Secondary | ICD-10-CM | POA: Diagnosis not present

## 2019-03-25 DIAGNOSIS — I10 Essential (primary) hypertension: Secondary | ICD-10-CM | POA: Diagnosis not present

## 2019-03-25 DIAGNOSIS — K589 Irritable bowel syndrome without diarrhea: Secondary | ICD-10-CM | POA: Diagnosis not present

## 2019-03-25 DIAGNOSIS — F418 Other specified anxiety disorders: Secondary | ICD-10-CM | POA: Diagnosis not present

## 2019-03-25 DIAGNOSIS — I2510 Atherosclerotic heart disease of native coronary artery without angina pectoris: Secondary | ICD-10-CM | POA: Diagnosis not present

## 2019-03-25 DIAGNOSIS — K219 Gastro-esophageal reflux disease without esophagitis: Secondary | ICD-10-CM | POA: Diagnosis not present

## 2019-03-25 DIAGNOSIS — Z955 Presence of coronary angioplasty implant and graft: Secondary | ICD-10-CM | POA: Diagnosis not present

## 2019-03-30 DIAGNOSIS — T85898A Other specified complication of other internal prosthetic devices, implants and grafts, initial encounter: Secondary | ICD-10-CM | POA: Diagnosis not present

## 2019-03-31 DIAGNOSIS — Z791 Long term (current) use of non-steroidal anti-inflammatories (NSAID): Secondary | ICD-10-CM | POA: Diagnosis not present

## 2019-03-31 DIAGNOSIS — Z79899 Other long term (current) drug therapy: Secondary | ICD-10-CM | POA: Diagnosis not present

## 2019-03-31 DIAGNOSIS — E785 Hyperlipidemia, unspecified: Secondary | ICD-10-CM | POA: Diagnosis not present

## 2019-03-31 DIAGNOSIS — K219 Gastro-esophageal reflux disease without esophagitis: Secondary | ICD-10-CM | POA: Diagnosis not present

## 2019-03-31 DIAGNOSIS — K589 Irritable bowel syndrome without diarrhea: Secondary | ICD-10-CM | POA: Diagnosis not present

## 2019-03-31 DIAGNOSIS — I10 Essential (primary) hypertension: Secondary | ICD-10-CM | POA: Diagnosis not present

## 2019-03-31 DIAGNOSIS — Z955 Presence of coronary angioplasty implant and graft: Secondary | ICD-10-CM | POA: Diagnosis not present

## 2019-03-31 DIAGNOSIS — Z7982 Long term (current) use of aspirin: Secondary | ICD-10-CM | POA: Diagnosis not present

## 2019-03-31 DIAGNOSIS — Z888 Allergy status to other drugs, medicaments and biological substances status: Secondary | ICD-10-CM | POA: Diagnosis not present

## 2019-03-31 DIAGNOSIS — I2510 Atherosclerotic heart disease of native coronary artery without angina pectoris: Secondary | ICD-10-CM | POA: Diagnosis not present

## 2019-03-31 DIAGNOSIS — Z881 Allergy status to other antibiotic agents status: Secondary | ICD-10-CM | POA: Diagnosis not present

## 2019-03-31 DIAGNOSIS — M13861 Other specified arthritis, right knee: Secondary | ICD-10-CM | POA: Diagnosis not present

## 2019-04-02 DIAGNOSIS — K589 Irritable bowel syndrome without diarrhea: Secondary | ICD-10-CM | POA: Diagnosis not present

## 2019-04-02 DIAGNOSIS — Z955 Presence of coronary angioplasty implant and graft: Secondary | ICD-10-CM | POA: Diagnosis not present

## 2019-04-02 DIAGNOSIS — K219 Gastro-esophageal reflux disease without esophagitis: Secondary | ICD-10-CM | POA: Diagnosis not present

## 2019-04-02 DIAGNOSIS — I10 Essential (primary) hypertension: Secondary | ICD-10-CM | POA: Diagnosis not present

## 2019-04-02 DIAGNOSIS — I2510 Atherosclerotic heart disease of native coronary artery without angina pectoris: Secondary | ICD-10-CM | POA: Diagnosis not present

## 2019-04-02 DIAGNOSIS — M13861 Other specified arthritis, right knee: Secondary | ICD-10-CM | POA: Diagnosis not present

## 2019-04-06 DIAGNOSIS — K589 Irritable bowel syndrome without diarrhea: Secondary | ICD-10-CM | POA: Diagnosis not present

## 2019-04-06 DIAGNOSIS — I10 Essential (primary) hypertension: Secondary | ICD-10-CM | POA: Diagnosis not present

## 2019-04-06 DIAGNOSIS — Z955 Presence of coronary angioplasty implant and graft: Secondary | ICD-10-CM | POA: Diagnosis not present

## 2019-04-06 DIAGNOSIS — K219 Gastro-esophageal reflux disease without esophagitis: Secondary | ICD-10-CM | POA: Diagnosis not present

## 2019-04-06 DIAGNOSIS — I2510 Atherosclerotic heart disease of native coronary artery without angina pectoris: Secondary | ICD-10-CM | POA: Diagnosis not present

## 2019-04-06 DIAGNOSIS — M13861 Other specified arthritis, right knee: Secondary | ICD-10-CM | POA: Diagnosis not present

## 2019-04-08 DIAGNOSIS — Z955 Presence of coronary angioplasty implant and graft: Secondary | ICD-10-CM | POA: Diagnosis not present

## 2019-04-08 DIAGNOSIS — M13861 Other specified arthritis, right knee: Secondary | ICD-10-CM | POA: Diagnosis not present

## 2019-04-08 DIAGNOSIS — I10 Essential (primary) hypertension: Secondary | ICD-10-CM | POA: Diagnosis not present

## 2019-04-08 DIAGNOSIS — K219 Gastro-esophageal reflux disease without esophagitis: Secondary | ICD-10-CM | POA: Diagnosis not present

## 2019-04-08 DIAGNOSIS — I2510 Atherosclerotic heart disease of native coronary artery without angina pectoris: Secondary | ICD-10-CM | POA: Diagnosis not present

## 2019-04-08 DIAGNOSIS — K589 Irritable bowel syndrome without diarrhea: Secondary | ICD-10-CM | POA: Diagnosis not present

## 2019-04-18 DIAGNOSIS — Z23 Encounter for immunization: Secondary | ICD-10-CM | POA: Diagnosis not present

## 2019-04-25 DIAGNOSIS — R002 Palpitations: Secondary | ICD-10-CM | POA: Diagnosis not present

## 2019-05-01 DIAGNOSIS — R002 Palpitations: Secondary | ICD-10-CM | POA: Diagnosis not present

## 2019-06-17 DIAGNOSIS — Z1339 Encounter for screening examination for other mental health and behavioral disorders: Secondary | ICD-10-CM | POA: Diagnosis not present

## 2019-06-17 DIAGNOSIS — Z0000 Encounter for general adult medical examination without abnormal findings: Secondary | ICD-10-CM | POA: Diagnosis not present

## 2019-06-17 DIAGNOSIS — I2699 Other pulmonary embolism without acute cor pulmonale: Secondary | ICD-10-CM | POA: Diagnosis not present

## 2019-06-17 DIAGNOSIS — Z6839 Body mass index (BMI) 39.0-39.9, adult: Secondary | ICD-10-CM | POA: Diagnosis not present

## 2019-06-17 DIAGNOSIS — Z7189 Other specified counseling: Secondary | ICD-10-CM | POA: Diagnosis not present

## 2019-06-17 DIAGNOSIS — Z1331 Encounter for screening for depression: Secondary | ICD-10-CM | POA: Diagnosis not present

## 2019-06-17 DIAGNOSIS — Z79899 Other long term (current) drug therapy: Secondary | ICD-10-CM | POA: Diagnosis not present

## 2019-06-17 DIAGNOSIS — Z1211 Encounter for screening for malignant neoplasm of colon: Secondary | ICD-10-CM | POA: Diagnosis not present

## 2019-06-17 DIAGNOSIS — R739 Hyperglycemia, unspecified: Secondary | ICD-10-CM | POA: Diagnosis not present

## 2019-06-17 DIAGNOSIS — I10 Essential (primary) hypertension: Secondary | ICD-10-CM | POA: Diagnosis not present

## 2019-06-17 DIAGNOSIS — R5383 Other fatigue: Secondary | ICD-10-CM | POA: Diagnosis not present

## 2019-06-17 DIAGNOSIS — Z299 Encounter for prophylactic measures, unspecified: Secondary | ICD-10-CM | POA: Diagnosis not present

## 2019-06-17 DIAGNOSIS — E559 Vitamin D deficiency, unspecified: Secondary | ICD-10-CM | POA: Diagnosis not present

## 2019-07-01 DIAGNOSIS — R609 Edema, unspecified: Secondary | ICD-10-CM | POA: Diagnosis not present

## 2019-07-01 DIAGNOSIS — I2510 Atherosclerotic heart disease of native coronary artery without angina pectoris: Secondary | ICD-10-CM | POA: Diagnosis not present

## 2019-07-01 DIAGNOSIS — I10 Essential (primary) hypertension: Secondary | ICD-10-CM | POA: Diagnosis not present

## 2019-07-01 DIAGNOSIS — E785 Hyperlipidemia, unspecified: Secondary | ICD-10-CM | POA: Diagnosis not present

## 2019-07-01 DIAGNOSIS — R58 Hemorrhage, not elsewhere classified: Secondary | ICD-10-CM | POA: Diagnosis not present

## 2019-09-24 DIAGNOSIS — I10 Essential (primary) hypertension: Secondary | ICD-10-CM | POA: Diagnosis not present

## 2019-09-24 DIAGNOSIS — I2699 Other pulmonary embolism without acute cor pulmonale: Secondary | ICD-10-CM | POA: Diagnosis not present

## 2019-09-24 DIAGNOSIS — Z299 Encounter for prophylactic measures, unspecified: Secondary | ICD-10-CM | POA: Diagnosis not present

## 2019-09-24 DIAGNOSIS — I2510 Atherosclerotic heart disease of native coronary artery without angina pectoris: Secondary | ICD-10-CM | POA: Diagnosis not present

## 2019-09-24 DIAGNOSIS — F322 Major depressive disorder, single episode, severe without psychotic features: Secondary | ICD-10-CM | POA: Diagnosis not present

## 2019-11-06 DIAGNOSIS — Z1231 Encounter for screening mammogram for malignant neoplasm of breast: Secondary | ICD-10-CM | POA: Diagnosis not present

## 2019-11-30 DIAGNOSIS — Z23 Encounter for immunization: Secondary | ICD-10-CM | POA: Diagnosis not present

## 2019-12-02 DIAGNOSIS — Z961 Presence of intraocular lens: Secondary | ICD-10-CM | POA: Diagnosis not present

## 2019-12-29 DIAGNOSIS — Z299 Encounter for prophylactic measures, unspecified: Secondary | ICD-10-CM | POA: Diagnosis not present

## 2019-12-29 DIAGNOSIS — J309 Allergic rhinitis, unspecified: Secondary | ICD-10-CM | POA: Diagnosis not present

## 2019-12-29 DIAGNOSIS — I10 Essential (primary) hypertension: Secondary | ICD-10-CM | POA: Diagnosis not present

## 2019-12-29 DIAGNOSIS — F322 Major depressive disorder, single episode, severe without psychotic features: Secondary | ICD-10-CM | POA: Diagnosis not present

## 2019-12-29 DIAGNOSIS — Z2821 Immunization not carried out because of patient refusal: Secondary | ICD-10-CM | POA: Diagnosis not present

## 2019-12-29 DIAGNOSIS — I2510 Atherosclerotic heart disease of native coronary artery without angina pectoris: Secondary | ICD-10-CM | POA: Diagnosis not present

## 2020-02-05 DIAGNOSIS — R609 Edema, unspecified: Secondary | ICD-10-CM | POA: Diagnosis not present

## 2020-02-05 DIAGNOSIS — I471 Supraventricular tachycardia: Secondary | ICD-10-CM | POA: Diagnosis not present

## 2020-02-05 DIAGNOSIS — E785 Hyperlipidemia, unspecified: Secondary | ICD-10-CM | POA: Diagnosis not present

## 2020-02-05 DIAGNOSIS — I10 Essential (primary) hypertension: Secondary | ICD-10-CM | POA: Diagnosis not present

## 2020-02-05 DIAGNOSIS — I2510 Atherosclerotic heart disease of native coronary artery without angina pectoris: Secondary | ICD-10-CM | POA: Diagnosis not present

## 2020-02-24 DIAGNOSIS — I2699 Other pulmonary embolism without acute cor pulmonale: Secondary | ICD-10-CM | POA: Diagnosis not present

## 2020-02-24 DIAGNOSIS — I10 Essential (primary) hypertension: Secondary | ICD-10-CM | POA: Diagnosis not present

## 2020-02-24 DIAGNOSIS — Z299 Encounter for prophylactic measures, unspecified: Secondary | ICD-10-CM | POA: Diagnosis not present

## 2020-02-24 DIAGNOSIS — J029 Acute pharyngitis, unspecified: Secondary | ICD-10-CM | POA: Diagnosis not present

## 2020-02-24 DIAGNOSIS — I2510 Atherosclerotic heart disease of native coronary artery without angina pectoris: Secondary | ICD-10-CM | POA: Diagnosis not present

## 2020-03-29 DIAGNOSIS — I10 Essential (primary) hypertension: Secondary | ICD-10-CM | POA: Diagnosis not present

## 2020-03-29 DIAGNOSIS — I2510 Atherosclerotic heart disease of native coronary artery without angina pectoris: Secondary | ICD-10-CM | POA: Diagnosis not present

## 2020-03-29 DIAGNOSIS — I2699 Other pulmonary embolism without acute cor pulmonale: Secondary | ICD-10-CM | POA: Diagnosis not present

## 2020-03-29 DIAGNOSIS — Z299 Encounter for prophylactic measures, unspecified: Secondary | ICD-10-CM | POA: Diagnosis not present

## 2020-03-29 DIAGNOSIS — Z6841 Body Mass Index (BMI) 40.0 and over, adult: Secondary | ICD-10-CM | POA: Diagnosis not present

## 2020-03-31 DIAGNOSIS — I2510 Atherosclerotic heart disease of native coronary artery without angina pectoris: Secondary | ICD-10-CM | POA: Diagnosis not present

## 2020-03-31 DIAGNOSIS — R29898 Other symptoms and signs involving the musculoskeletal system: Secondary | ICD-10-CM | POA: Diagnosis not present

## 2020-03-31 DIAGNOSIS — Z299 Encounter for prophylactic measures, unspecified: Secondary | ICD-10-CM | POA: Diagnosis not present

## 2020-03-31 DIAGNOSIS — R202 Paresthesia of skin: Secondary | ICD-10-CM | POA: Diagnosis not present

## 2020-03-31 DIAGNOSIS — Z6841 Body Mass Index (BMI) 40.0 and over, adult: Secondary | ICD-10-CM | POA: Diagnosis not present

## 2020-03-31 DIAGNOSIS — I10 Essential (primary) hypertension: Secondary | ICD-10-CM | POA: Diagnosis not present

## 2020-03-31 DIAGNOSIS — M542 Cervicalgia: Secondary | ICD-10-CM | POA: Diagnosis not present

## 2020-03-31 DIAGNOSIS — F419 Anxiety disorder, unspecified: Secondary | ICD-10-CM | POA: Diagnosis not present

## 2020-05-12 DIAGNOSIS — R609 Edema, unspecified: Secondary | ICD-10-CM | POA: Diagnosis not present

## 2020-05-12 DIAGNOSIS — I10 Essential (primary) hypertension: Secondary | ICD-10-CM | POA: Diagnosis not present

## 2020-05-12 DIAGNOSIS — R0609 Other forms of dyspnea: Secondary | ICD-10-CM | POA: Diagnosis not present

## 2020-05-12 DIAGNOSIS — I2510 Atherosclerotic heart disease of native coronary artery without angina pectoris: Secondary | ICD-10-CM | POA: Diagnosis not present

## 2020-05-12 DIAGNOSIS — I471 Supraventricular tachycardia: Secondary | ICD-10-CM | POA: Diagnosis not present

## 2020-05-12 DIAGNOSIS — E785 Hyperlipidemia, unspecified: Secondary | ICD-10-CM | POA: Diagnosis not present

## 2020-05-12 DIAGNOSIS — R58 Hemorrhage, not elsewhere classified: Secondary | ICD-10-CM | POA: Diagnosis not present

## 2020-05-17 DIAGNOSIS — I480 Paroxysmal atrial fibrillation: Secondary | ICD-10-CM | POA: Diagnosis not present

## 2020-05-20 DIAGNOSIS — I471 Supraventricular tachycardia: Secondary | ICD-10-CM | POA: Diagnosis not present

## 2020-06-22 DIAGNOSIS — R5383 Other fatigue: Secondary | ICD-10-CM | POA: Diagnosis not present

## 2020-06-22 DIAGNOSIS — Z7189 Other specified counseling: Secondary | ICD-10-CM | POA: Diagnosis not present

## 2020-06-22 DIAGNOSIS — Z6841 Body Mass Index (BMI) 40.0 and over, adult: Secondary | ICD-10-CM | POA: Diagnosis not present

## 2020-06-22 DIAGNOSIS — I10 Essential (primary) hypertension: Secondary | ICD-10-CM | POA: Diagnosis not present

## 2020-06-22 DIAGNOSIS — Z79899 Other long term (current) drug therapy: Secondary | ICD-10-CM | POA: Diagnosis not present

## 2020-06-22 DIAGNOSIS — E7800 Pure hypercholesterolemia, unspecified: Secondary | ICD-10-CM | POA: Diagnosis not present

## 2020-06-22 DIAGNOSIS — Z789 Other specified health status: Secondary | ICD-10-CM | POA: Diagnosis not present

## 2020-06-22 DIAGNOSIS — Z1339 Encounter for screening examination for other mental health and behavioral disorders: Secondary | ICD-10-CM | POA: Diagnosis not present

## 2020-06-22 DIAGNOSIS — Z299 Encounter for prophylactic measures, unspecified: Secondary | ICD-10-CM | POA: Diagnosis not present

## 2020-06-22 DIAGNOSIS — Z0000 Encounter for general adult medical examination without abnormal findings: Secondary | ICD-10-CM | POA: Diagnosis not present

## 2020-06-22 DIAGNOSIS — Z1331 Encounter for screening for depression: Secondary | ICD-10-CM | POA: Diagnosis not present

## 2020-06-22 DIAGNOSIS — E559 Vitamin D deficiency, unspecified: Secondary | ICD-10-CM | POA: Diagnosis not present

## 2020-09-15 DIAGNOSIS — R0609 Other forms of dyspnea: Secondary | ICD-10-CM | POA: Diagnosis not present

## 2020-09-15 DIAGNOSIS — R002 Palpitations: Secondary | ICD-10-CM | POA: Diagnosis not present

## 2020-09-15 DIAGNOSIS — I471 Supraventricular tachycardia: Secondary | ICD-10-CM | POA: Diagnosis not present

## 2020-09-15 DIAGNOSIS — I2510 Atherosclerotic heart disease of native coronary artery without angina pectoris: Secondary | ICD-10-CM | POA: Diagnosis not present

## 2020-09-15 DIAGNOSIS — I10 Essential (primary) hypertension: Secondary | ICD-10-CM | POA: Diagnosis not present

## 2020-09-15 DIAGNOSIS — R5383 Other fatigue: Secondary | ICD-10-CM | POA: Diagnosis not present

## 2020-09-20 DIAGNOSIS — I10 Essential (primary) hypertension: Secondary | ICD-10-CM | POA: Diagnosis not present

## 2020-09-20 DIAGNOSIS — Z299 Encounter for prophylactic measures, unspecified: Secondary | ICD-10-CM | POA: Diagnosis not present

## 2020-09-20 DIAGNOSIS — F331 Major depressive disorder, recurrent, moderate: Secondary | ICD-10-CM | POA: Diagnosis not present

## 2020-09-23 DIAGNOSIS — I499 Cardiac arrhythmia, unspecified: Secondary | ICD-10-CM | POA: Diagnosis not present

## 2020-09-23 DIAGNOSIS — R0609 Other forms of dyspnea: Secondary | ICD-10-CM | POA: Diagnosis not present

## 2020-09-23 DIAGNOSIS — I2510 Atherosclerotic heart disease of native coronary artery without angina pectoris: Secondary | ICD-10-CM | POA: Diagnosis not present

## 2020-09-26 DIAGNOSIS — I2510 Atherosclerotic heart disease of native coronary artery without angina pectoris: Secondary | ICD-10-CM | POA: Diagnosis not present

## 2020-09-26 DIAGNOSIS — R0609 Other forms of dyspnea: Secondary | ICD-10-CM | POA: Diagnosis not present

## 2020-09-26 DIAGNOSIS — R5383 Other fatigue: Secondary | ICD-10-CM | POA: Diagnosis not present

## 2020-09-26 DIAGNOSIS — I10 Essential (primary) hypertension: Secondary | ICD-10-CM | POA: Diagnosis not present

## 2020-10-12 DIAGNOSIS — R000 Tachycardia, unspecified: Secondary | ICD-10-CM | POA: Diagnosis not present

## 2020-10-13 DIAGNOSIS — R000 Tachycardia, unspecified: Secondary | ICD-10-CM | POA: Diagnosis not present

## 2020-10-18 DIAGNOSIS — Z6838 Body mass index (BMI) 38.0-38.9, adult: Secondary | ICD-10-CM | POA: Diagnosis not present

## 2020-10-18 DIAGNOSIS — Z299 Encounter for prophylactic measures, unspecified: Secondary | ICD-10-CM | POA: Diagnosis not present

## 2020-10-18 DIAGNOSIS — I10 Essential (primary) hypertension: Secondary | ICD-10-CM | POA: Diagnosis not present

## 2020-10-18 DIAGNOSIS — I471 Supraventricular tachycardia: Secondary | ICD-10-CM | POA: Diagnosis not present

## 2020-10-18 DIAGNOSIS — K219 Gastro-esophageal reflux disease without esophagitis: Secondary | ICD-10-CM | POA: Diagnosis not present

## 2020-10-18 DIAGNOSIS — Z2821 Immunization not carried out because of patient refusal: Secondary | ICD-10-CM | POA: Diagnosis not present

## 2020-11-08 DIAGNOSIS — Z6839 Body mass index (BMI) 39.0-39.9, adult: Secondary | ICD-10-CM | POA: Diagnosis not present

## 2020-11-08 DIAGNOSIS — Z299 Encounter for prophylactic measures, unspecified: Secondary | ICD-10-CM | POA: Diagnosis not present

## 2020-11-08 DIAGNOSIS — Z2821 Immunization not carried out because of patient refusal: Secondary | ICD-10-CM | POA: Diagnosis not present

## 2020-11-08 DIAGNOSIS — I10 Essential (primary) hypertension: Secondary | ICD-10-CM | POA: Diagnosis not present

## 2020-11-08 DIAGNOSIS — R0789 Other chest pain: Secondary | ICD-10-CM | POA: Diagnosis not present

## 2020-11-08 DIAGNOSIS — I471 Supraventricular tachycardia: Secondary | ICD-10-CM | POA: Diagnosis not present

## 2020-11-08 DIAGNOSIS — F419 Anxiety disorder, unspecified: Secondary | ICD-10-CM | POA: Diagnosis not present

## 2020-11-11 DIAGNOSIS — R9431 Abnormal electrocardiogram [ECG] [EKG]: Secondary | ICD-10-CM | POA: Diagnosis not present

## 2020-11-11 DIAGNOSIS — I471 Supraventricular tachycardia: Secondary | ICD-10-CM | POA: Diagnosis not present

## 2020-11-11 DIAGNOSIS — I491 Atrial premature depolarization: Secondary | ICD-10-CM | POA: Diagnosis not present

## 2020-11-23 DIAGNOSIS — I10 Essential (primary) hypertension: Secondary | ICD-10-CM | POA: Diagnosis not present

## 2020-11-23 DIAGNOSIS — R0609 Other forms of dyspnea: Secondary | ICD-10-CM | POA: Diagnosis not present

## 2020-11-23 DIAGNOSIS — R58 Hemorrhage, not elsewhere classified: Secondary | ICD-10-CM | POA: Diagnosis not present

## 2020-11-23 DIAGNOSIS — R609 Edema, unspecified: Secondary | ICD-10-CM | POA: Diagnosis not present

## 2020-11-23 DIAGNOSIS — I2510 Atherosclerotic heart disease of native coronary artery without angina pectoris: Secondary | ICD-10-CM | POA: Diagnosis not present

## 2020-11-23 DIAGNOSIS — I471 Supraventricular tachycardia: Secondary | ICD-10-CM | POA: Diagnosis not present

## 2020-11-23 DIAGNOSIS — E785 Hyperlipidemia, unspecified: Secondary | ICD-10-CM | POA: Diagnosis not present

## 2020-12-13 DIAGNOSIS — I491 Atrial premature depolarization: Secondary | ICD-10-CM | POA: Diagnosis not present

## 2020-12-13 DIAGNOSIS — I471 Supraventricular tachycardia: Secondary | ICD-10-CM | POA: Diagnosis not present

## 2021-01-05 DIAGNOSIS — Z961 Presence of intraocular lens: Secondary | ICD-10-CM | POA: Diagnosis not present

## 2021-01-26 DIAGNOSIS — Z299 Encounter for prophylactic measures, unspecified: Secondary | ICD-10-CM | POA: Diagnosis not present

## 2021-01-26 DIAGNOSIS — R519 Headache, unspecified: Secondary | ICD-10-CM | POA: Diagnosis not present

## 2021-01-26 DIAGNOSIS — U071 COVID-19: Secondary | ICD-10-CM | POA: Diagnosis not present

## 2021-01-26 DIAGNOSIS — Z6839 Body mass index (BMI) 39.0-39.9, adult: Secondary | ICD-10-CM | POA: Diagnosis not present

## 2021-01-26 DIAGNOSIS — R5383 Other fatigue: Secondary | ICD-10-CM | POA: Diagnosis not present

## 2021-02-20 DIAGNOSIS — N390 Urinary tract infection, site not specified: Secondary | ICD-10-CM | POA: Diagnosis not present

## 2021-02-20 DIAGNOSIS — I10 Essential (primary) hypertension: Secondary | ICD-10-CM | POA: Diagnosis not present

## 2021-02-20 DIAGNOSIS — Z299 Encounter for prophylactic measures, unspecified: Secondary | ICD-10-CM | POA: Diagnosis not present

## 2021-02-20 DIAGNOSIS — Z789 Other specified health status: Secondary | ICD-10-CM | POA: Diagnosis not present

## 2021-02-20 DIAGNOSIS — Z6839 Body mass index (BMI) 39.0-39.9, adult: Secondary | ICD-10-CM | POA: Diagnosis not present

## 2021-04-19 DIAGNOSIS — M1712 Unilateral primary osteoarthritis, left knee: Secondary | ICD-10-CM | POA: Diagnosis not present

## 2021-05-24 DIAGNOSIS — Z6837 Body mass index (BMI) 37.0-37.9, adult: Secondary | ICD-10-CM | POA: Diagnosis not present

## 2021-05-24 DIAGNOSIS — I471 Supraventricular tachycardia: Secondary | ICD-10-CM | POA: Diagnosis not present

## 2021-05-24 DIAGNOSIS — E785 Hyperlipidemia, unspecified: Secondary | ICD-10-CM | POA: Diagnosis not present

## 2021-05-24 DIAGNOSIS — R609 Edema, unspecified: Secondary | ICD-10-CM | POA: Diagnosis not present

## 2021-05-24 DIAGNOSIS — I2510 Atherosclerotic heart disease of native coronary artery without angina pectoris: Secondary | ICD-10-CM | POA: Diagnosis not present

## 2021-05-24 DIAGNOSIS — R0609 Other forms of dyspnea: Secondary | ICD-10-CM | POA: Diagnosis not present

## 2021-05-24 DIAGNOSIS — R58 Hemorrhage, not elsewhere classified: Secondary | ICD-10-CM | POA: Diagnosis not present

## 2021-05-24 DIAGNOSIS — I10 Essential (primary) hypertension: Secondary | ICD-10-CM | POA: Diagnosis not present

## 2021-06-28 DIAGNOSIS — M8580 Other specified disorders of bone density and structure, unspecified site: Secondary | ICD-10-CM | POA: Diagnosis not present

## 2021-06-28 DIAGNOSIS — R5383 Other fatigue: Secondary | ICD-10-CM | POA: Diagnosis not present

## 2021-06-28 DIAGNOSIS — Z0000 Encounter for general adult medical examination without abnormal findings: Secondary | ICD-10-CM | POA: Diagnosis not present

## 2021-06-28 DIAGNOSIS — Z7189 Other specified counseling: Secondary | ICD-10-CM | POA: Diagnosis not present

## 2021-06-28 DIAGNOSIS — Z1339 Encounter for screening examination for other mental health and behavioral disorders: Secondary | ICD-10-CM | POA: Diagnosis not present

## 2021-06-28 DIAGNOSIS — Z1331 Encounter for screening for depression: Secondary | ICD-10-CM | POA: Diagnosis not present

## 2021-06-28 DIAGNOSIS — Z789 Other specified health status: Secondary | ICD-10-CM | POA: Diagnosis not present

## 2021-06-28 DIAGNOSIS — Z6839 Body mass index (BMI) 39.0-39.9, adult: Secondary | ICD-10-CM | POA: Diagnosis not present

## 2021-06-28 DIAGNOSIS — Z299 Encounter for prophylactic measures, unspecified: Secondary | ICD-10-CM | POA: Diagnosis not present

## 2021-06-28 DIAGNOSIS — E7800 Pure hypercholesterolemia, unspecified: Secondary | ICD-10-CM | POA: Diagnosis not present

## 2021-06-28 DIAGNOSIS — Z79899 Other long term (current) drug therapy: Secondary | ICD-10-CM | POA: Diagnosis not present

## 2021-06-28 DIAGNOSIS — I10 Essential (primary) hypertension: Secondary | ICD-10-CM | POA: Diagnosis not present

## 2021-07-04 DIAGNOSIS — E2839 Other primary ovarian failure: Secondary | ICD-10-CM | POA: Diagnosis not present

## 2021-07-04 DIAGNOSIS — Z79899 Other long term (current) drug therapy: Secondary | ICD-10-CM | POA: Diagnosis not present

## 2021-07-04 DIAGNOSIS — M859 Disorder of bone density and structure, unspecified: Secondary | ICD-10-CM | POA: Diagnosis not present

## 2021-08-24 DIAGNOSIS — N390 Urinary tract infection, site not specified: Secondary | ICD-10-CM | POA: Diagnosis not present

## 2021-08-24 DIAGNOSIS — I10 Essential (primary) hypertension: Secondary | ICD-10-CM | POA: Diagnosis not present

## 2021-08-24 DIAGNOSIS — F331 Major depressive disorder, recurrent, moderate: Secondary | ICD-10-CM | POA: Diagnosis not present

## 2021-08-24 DIAGNOSIS — Z299 Encounter for prophylactic measures, unspecified: Secondary | ICD-10-CM | POA: Diagnosis not present

## 2021-08-24 DIAGNOSIS — Z789 Other specified health status: Secondary | ICD-10-CM | POA: Diagnosis not present

## 2021-08-24 DIAGNOSIS — R350 Frequency of micturition: Secondary | ICD-10-CM | POA: Diagnosis not present

## 2021-10-03 DIAGNOSIS — Z789 Other specified health status: Secondary | ICD-10-CM | POA: Diagnosis not present

## 2021-10-03 DIAGNOSIS — Z299 Encounter for prophylactic measures, unspecified: Secondary | ICD-10-CM | POA: Diagnosis not present

## 2021-10-03 DIAGNOSIS — I10 Essential (primary) hypertension: Secondary | ICD-10-CM | POA: Diagnosis not present

## 2021-11-23 DIAGNOSIS — R58 Hemorrhage, not elsewhere classified: Secondary | ICD-10-CM | POA: Diagnosis not present

## 2021-11-23 DIAGNOSIS — I471 Supraventricular tachycardia, unspecified: Secondary | ICD-10-CM | POA: Diagnosis not present

## 2021-11-23 DIAGNOSIS — R079 Chest pain, unspecified: Secondary | ICD-10-CM | POA: Diagnosis not present

## 2021-11-23 DIAGNOSIS — R0609 Other forms of dyspnea: Secondary | ICD-10-CM | POA: Diagnosis not present

## 2021-11-23 DIAGNOSIS — I491 Atrial premature depolarization: Secondary | ICD-10-CM | POA: Diagnosis not present

## 2021-11-23 DIAGNOSIS — E785 Hyperlipidemia, unspecified: Secondary | ICD-10-CM | POA: Diagnosis not present

## 2021-11-23 DIAGNOSIS — R609 Edema, unspecified: Secondary | ICD-10-CM | POA: Diagnosis not present

## 2021-11-23 DIAGNOSIS — I10 Essential (primary) hypertension: Secondary | ICD-10-CM | POA: Diagnosis not present

## 2021-11-23 DIAGNOSIS — Z6837 Body mass index (BMI) 37.0-37.9, adult: Secondary | ICD-10-CM | POA: Diagnosis not present

## 2021-11-23 DIAGNOSIS — I2510 Atherosclerotic heart disease of native coronary artery without angina pectoris: Secondary | ICD-10-CM | POA: Diagnosis not present

## 2021-11-30 DIAGNOSIS — R0789 Other chest pain: Secondary | ICD-10-CM | POA: Diagnosis not present

## 2021-11-30 DIAGNOSIS — R0600 Dyspnea, unspecified: Secondary | ICD-10-CM | POA: Diagnosis not present

## 2021-11-30 DIAGNOSIS — I2510 Atherosclerotic heart disease of native coronary artery without angina pectoris: Secondary | ICD-10-CM | POA: Diagnosis not present

## 2021-11-30 DIAGNOSIS — I499 Cardiac arrhythmia, unspecified: Secondary | ICD-10-CM | POA: Diagnosis not present

## 2021-12-07 DIAGNOSIS — L814 Other melanin hyperpigmentation: Secondary | ICD-10-CM | POA: Diagnosis not present

## 2021-12-07 DIAGNOSIS — L281 Prurigo nodularis: Secondary | ICD-10-CM | POA: Diagnosis not present

## 2021-12-07 DIAGNOSIS — L700 Acne vulgaris: Secondary | ICD-10-CM | POA: Diagnosis not present

## 2021-12-07 DIAGNOSIS — L821 Other seborrheic keratosis: Secondary | ICD-10-CM | POA: Diagnosis not present

## 2021-12-07 DIAGNOSIS — D1801 Hemangioma of skin and subcutaneous tissue: Secondary | ICD-10-CM | POA: Diagnosis not present

## 2021-12-07 DIAGNOSIS — D485 Neoplasm of uncertain behavior of skin: Secondary | ICD-10-CM | POA: Diagnosis not present

## 2021-12-22 DIAGNOSIS — Z2821 Immunization not carried out because of patient refusal: Secondary | ICD-10-CM | POA: Diagnosis not present

## 2021-12-22 DIAGNOSIS — J069 Acute upper respiratory infection, unspecified: Secondary | ICD-10-CM | POA: Diagnosis not present

## 2021-12-22 DIAGNOSIS — Z299 Encounter for prophylactic measures, unspecified: Secondary | ICD-10-CM | POA: Diagnosis not present

## 2021-12-22 DIAGNOSIS — Z789 Other specified health status: Secondary | ICD-10-CM | POA: Diagnosis not present

## 2021-12-22 DIAGNOSIS — R5383 Other fatigue: Secondary | ICD-10-CM | POA: Diagnosis not present

## 2021-12-22 DIAGNOSIS — Z6841 Body Mass Index (BMI) 40.0 and over, adult: Secondary | ICD-10-CM | POA: Diagnosis not present

## 2022-01-03 DIAGNOSIS — E785 Hyperlipidemia, unspecified: Secondary | ICD-10-CM | POA: Diagnosis not present

## 2022-01-03 DIAGNOSIS — Z299 Encounter for prophylactic measures, unspecified: Secondary | ICD-10-CM | POA: Diagnosis not present

## 2022-01-03 DIAGNOSIS — J40 Bronchitis, not specified as acute or chronic: Secondary | ICD-10-CM | POA: Diagnosis not present

## 2022-01-03 DIAGNOSIS — I10 Essential (primary) hypertension: Secondary | ICD-10-CM | POA: Diagnosis not present

## 2022-01-03 DIAGNOSIS — Z789 Other specified health status: Secondary | ICD-10-CM | POA: Diagnosis not present

## 2022-03-05 DIAGNOSIS — R600 Localized edema: Secondary | ICD-10-CM | POA: Diagnosis not present

## 2022-03-05 DIAGNOSIS — R0609 Other forms of dyspnea: Secondary | ICD-10-CM | POA: Diagnosis not present

## 2022-03-05 DIAGNOSIS — I10 Essential (primary) hypertension: Secondary | ICD-10-CM | POA: Diagnosis not present

## 2022-03-05 DIAGNOSIS — R58 Hemorrhage, not elsewhere classified: Secondary | ICD-10-CM | POA: Diagnosis not present

## 2022-03-05 DIAGNOSIS — I471 Supraventricular tachycardia, unspecified: Secondary | ICD-10-CM | POA: Diagnosis not present

## 2022-03-05 DIAGNOSIS — R0789 Other chest pain: Secondary | ICD-10-CM | POA: Diagnosis not present

## 2022-03-05 DIAGNOSIS — I2510 Atherosclerotic heart disease of native coronary artery without angina pectoris: Secondary | ICD-10-CM | POA: Diagnosis not present

## 2022-03-05 DIAGNOSIS — E785 Hyperlipidemia, unspecified: Secondary | ICD-10-CM | POA: Diagnosis not present

## 2022-03-05 DIAGNOSIS — Z6838 Body mass index (BMI) 38.0-38.9, adult: Secondary | ICD-10-CM | POA: Diagnosis not present

## 2022-03-05 DIAGNOSIS — I491 Atrial premature depolarization: Secondary | ICD-10-CM | POA: Diagnosis not present

## 2022-04-05 DIAGNOSIS — Z299 Encounter for prophylactic measures, unspecified: Secondary | ICD-10-CM | POA: Diagnosis not present

## 2022-04-05 DIAGNOSIS — R609 Edema, unspecified: Secondary | ICD-10-CM | POA: Diagnosis not present

## 2022-04-05 DIAGNOSIS — I10 Essential (primary) hypertension: Secondary | ICD-10-CM | POA: Diagnosis not present

## 2022-04-05 DIAGNOSIS — Z792 Long term (current) use of antibiotics: Secondary | ICD-10-CM | POA: Diagnosis not present

## 2022-07-03 DIAGNOSIS — I10 Essential (primary) hypertension: Secondary | ICD-10-CM | POA: Diagnosis not present

## 2022-07-03 DIAGNOSIS — Z299 Encounter for prophylactic measures, unspecified: Secondary | ICD-10-CM | POA: Diagnosis not present

## 2022-07-03 DIAGNOSIS — R5383 Other fatigue: Secondary | ICD-10-CM | POA: Diagnosis not present

## 2022-07-03 DIAGNOSIS — Z1339 Encounter for screening examination for other mental health and behavioral disorders: Secondary | ICD-10-CM | POA: Diagnosis not present

## 2022-07-03 DIAGNOSIS — Z1331 Encounter for screening for depression: Secondary | ICD-10-CM | POA: Diagnosis not present

## 2022-07-03 DIAGNOSIS — Z0000 Encounter for general adult medical examination without abnormal findings: Secondary | ICD-10-CM | POA: Diagnosis not present

## 2022-07-03 DIAGNOSIS — E7800 Pure hypercholesterolemia, unspecified: Secondary | ICD-10-CM | POA: Diagnosis not present

## 2022-07-03 DIAGNOSIS — Z7189 Other specified counseling: Secondary | ICD-10-CM | POA: Diagnosis not present

## 2022-07-03 DIAGNOSIS — Z79899 Other long term (current) drug therapy: Secondary | ICD-10-CM | POA: Diagnosis not present

## 2022-07-03 DIAGNOSIS — E559 Vitamin D deficiency, unspecified: Secondary | ICD-10-CM | POA: Diagnosis not present

## 2022-07-03 DIAGNOSIS — F331 Major depressive disorder, recurrent, moderate: Secondary | ICD-10-CM | POA: Diagnosis not present

## 2022-07-10 DIAGNOSIS — I2510 Atherosclerotic heart disease of native coronary artery without angina pectoris: Secondary | ICD-10-CM | POA: Diagnosis not present

## 2022-07-10 DIAGNOSIS — R609 Edema, unspecified: Secondary | ICD-10-CM | POA: Diagnosis not present

## 2022-07-10 DIAGNOSIS — I10 Essential (primary) hypertension: Secondary | ICD-10-CM | POA: Diagnosis not present

## 2022-07-10 DIAGNOSIS — Z299 Encounter for prophylactic measures, unspecified: Secondary | ICD-10-CM | POA: Diagnosis not present

## 2022-07-10 DIAGNOSIS — E785 Hyperlipidemia, unspecified: Secondary | ICD-10-CM | POA: Diagnosis not present

## 2022-07-10 DIAGNOSIS — Z6838 Body mass index (BMI) 38.0-38.9, adult: Secondary | ICD-10-CM | POA: Diagnosis not present

## 2022-07-10 DIAGNOSIS — R58 Hemorrhage, not elsewhere classified: Secondary | ICD-10-CM | POA: Diagnosis not present

## 2022-07-10 DIAGNOSIS — I471 Supraventricular tachycardia, unspecified: Secondary | ICD-10-CM | POA: Diagnosis not present

## 2022-07-10 DIAGNOSIS — R0609 Other forms of dyspnea: Secondary | ICD-10-CM | POA: Diagnosis not present

## 2022-07-10 DIAGNOSIS — T63441A Toxic effect of venom of bees, accidental (unintentional), initial encounter: Secondary | ICD-10-CM | POA: Diagnosis not present

## 2022-08-09 ENCOUNTER — Other Ambulatory Visit: Payer: Self-pay | Admitting: Internal Medicine

## 2022-08-09 DIAGNOSIS — R059 Cough, unspecified: Secondary | ICD-10-CM | POA: Diagnosis not present

## 2022-08-09 DIAGNOSIS — I10 Essential (primary) hypertension: Secondary | ICD-10-CM | POA: Diagnosis not present

## 2022-08-09 DIAGNOSIS — H9312 Tinnitus, left ear: Secondary | ICD-10-CM | POA: Diagnosis not present

## 2022-08-09 DIAGNOSIS — Z1231 Encounter for screening mammogram for malignant neoplasm of breast: Secondary | ICD-10-CM

## 2022-08-09 DIAGNOSIS — H6992 Unspecified Eustachian tube disorder, left ear: Secondary | ICD-10-CM | POA: Diagnosis not present

## 2022-08-09 DIAGNOSIS — Z299 Encounter for prophylactic measures, unspecified: Secondary | ICD-10-CM | POA: Diagnosis not present

## 2022-08-16 DIAGNOSIS — H52221 Regular astigmatism, right eye: Secondary | ICD-10-CM | POA: Diagnosis not present

## 2022-08-16 DIAGNOSIS — H5201 Hypermetropia, right eye: Secondary | ICD-10-CM | POA: Diagnosis not present

## 2022-08-16 DIAGNOSIS — H04123 Dry eye syndrome of bilateral lacrimal glands: Secondary | ICD-10-CM | POA: Diagnosis not present

## 2022-08-16 DIAGNOSIS — H4301 Vitreous prolapse, right eye: Secondary | ICD-10-CM | POA: Diagnosis not present

## 2022-08-16 DIAGNOSIS — H524 Presbyopia: Secondary | ICD-10-CM | POA: Diagnosis not present

## 2022-08-16 DIAGNOSIS — H43811 Vitreous degeneration, right eye: Secondary | ICD-10-CM | POA: Diagnosis not present

## 2022-08-16 DIAGNOSIS — Z961 Presence of intraocular lens: Secondary | ICD-10-CM | POA: Diagnosis not present

## 2022-08-16 DIAGNOSIS — H5212 Myopia, left eye: Secondary | ICD-10-CM | POA: Diagnosis not present

## 2022-08-16 DIAGNOSIS — Z9842 Cataract extraction status, left eye: Secondary | ICD-10-CM | POA: Diagnosis not present

## 2022-08-29 DIAGNOSIS — H35033 Hypertensive retinopathy, bilateral: Secondary | ICD-10-CM | POA: Diagnosis not present

## 2022-08-29 DIAGNOSIS — H43813 Vitreous degeneration, bilateral: Secondary | ICD-10-CM | POA: Diagnosis not present

## 2022-08-29 DIAGNOSIS — H4303 Vitreous prolapse, bilateral: Secondary | ICD-10-CM | POA: Diagnosis not present

## 2022-09-11 ENCOUNTER — Ambulatory Visit
Admission: RE | Admit: 2022-09-11 | Discharge: 2022-09-11 | Disposition: A | Discharge status: Home / Self Care | Payer: Medicare Other | Source: Ambulatory Visit | Attending: Internal Medicine | Admitting: Internal Medicine

## 2022-09-11 DIAGNOSIS — Z1231 Encounter for screening mammogram for malignant neoplasm of breast: Secondary | ICD-10-CM

## 2022-09-14 ENCOUNTER — Other Ambulatory Visit: Payer: Self-pay | Admitting: Internal Medicine

## 2022-09-14 DIAGNOSIS — R928 Other abnormal and inconclusive findings on diagnostic imaging of breast: Secondary | ICD-10-CM

## 2022-09-24 ENCOUNTER — Ambulatory Visit: Payer: Medicare Other

## 2022-09-24 ENCOUNTER — Ambulatory Visit
Admission: RE | Admit: 2022-09-24 | Discharge: 2022-09-24 | Disposition: A | Discharge status: Home / Self Care | Payer: Medicare Other | Source: Ambulatory Visit | Attending: Internal Medicine | Admitting: Internal Medicine

## 2022-09-24 DIAGNOSIS — R928 Other abnormal and inconclusive findings on diagnostic imaging of breast: Secondary | ICD-10-CM

## 2022-11-01 DIAGNOSIS — R6883 Chills (without fever): Secondary | ICD-10-CM | POA: Diagnosis not present

## 2022-11-01 DIAGNOSIS — U071 COVID-19: Secondary | ICD-10-CM | POA: Diagnosis not present

## 2022-11-01 DIAGNOSIS — Z299 Encounter for prophylactic measures, unspecified: Secondary | ICD-10-CM | POA: Diagnosis not present

## 2022-11-08 DIAGNOSIS — I471 Supraventricular tachycardia, unspecified: Secondary | ICD-10-CM | POA: Diagnosis not present

## 2022-11-08 DIAGNOSIS — M549 Dorsalgia, unspecified: Secondary | ICD-10-CM | POA: Diagnosis not present

## 2022-11-08 DIAGNOSIS — R0602 Shortness of breath: Secondary | ICD-10-CM | POA: Diagnosis not present

## 2022-11-09 DIAGNOSIS — M47816 Spondylosis without myelopathy or radiculopathy, lumbar region: Secondary | ICD-10-CM | POA: Diagnosis not present

## 2022-11-09 DIAGNOSIS — M5136 Other intervertebral disc degeneration, lumbar region with discogenic back pain only: Secondary | ICD-10-CM | POA: Diagnosis not present

## 2022-11-09 DIAGNOSIS — M545 Low back pain, unspecified: Secondary | ICD-10-CM | POA: Diagnosis not present

## 2022-11-09 DIAGNOSIS — Z981 Arthrodesis status: Secondary | ICD-10-CM | POA: Diagnosis not present

## 2022-11-09 DIAGNOSIS — Z9889 Other specified postprocedural states: Secondary | ICD-10-CM | POA: Diagnosis not present

## 2022-11-21 DIAGNOSIS — Z6841 Body Mass Index (BMI) 40.0 and over, adult: Secondary | ICD-10-CM | POA: Diagnosis not present

## 2022-11-21 DIAGNOSIS — Z2821 Immunization not carried out because of patient refusal: Secondary | ICD-10-CM | POA: Diagnosis not present

## 2022-11-21 DIAGNOSIS — M545 Low back pain, unspecified: Secondary | ICD-10-CM | POA: Diagnosis not present

## 2022-11-21 DIAGNOSIS — Z299 Encounter for prophylactic measures, unspecified: Secondary | ICD-10-CM | POA: Diagnosis not present

## 2022-11-21 DIAGNOSIS — I10 Essential (primary) hypertension: Secondary | ICD-10-CM | POA: Diagnosis not present

## 2022-11-28 DIAGNOSIS — M48061 Spinal stenosis, lumbar region without neurogenic claudication: Secondary | ICD-10-CM | POA: Diagnosis not present

## 2022-11-28 DIAGNOSIS — M47815 Spondylosis without myelopathy or radiculopathy, thoracolumbar region: Secondary | ICD-10-CM | POA: Diagnosis not present

## 2022-11-28 DIAGNOSIS — M47817 Spondylosis without myelopathy or radiculopathy, lumbosacral region: Secondary | ICD-10-CM | POA: Diagnosis not present

## 2022-11-28 DIAGNOSIS — M47816 Spondylosis without myelopathy or radiculopathy, lumbar region: Secondary | ICD-10-CM | POA: Diagnosis not present

## 2022-11-28 DIAGNOSIS — I700 Atherosclerosis of aorta: Secondary | ICD-10-CM | POA: Diagnosis not present

## 2022-12-10 DIAGNOSIS — M51362 Other intervertebral disc degeneration, lumbar region with discogenic back pain and lower extremity pain: Secondary | ICD-10-CM | POA: Diagnosis not present

## 2022-12-17 DIAGNOSIS — R233 Spontaneous ecchymoses: Secondary | ICD-10-CM | POA: Diagnosis not present

## 2022-12-17 DIAGNOSIS — D225 Melanocytic nevi of trunk: Secondary | ICD-10-CM | POA: Diagnosis not present

## 2022-12-17 DIAGNOSIS — L0889 Other specified local infections of the skin and subcutaneous tissue: Secondary | ICD-10-CM | POA: Diagnosis not present

## 2022-12-17 DIAGNOSIS — B078 Other viral warts: Secondary | ICD-10-CM | POA: Diagnosis not present

## 2022-12-17 DIAGNOSIS — L821 Other seborrheic keratosis: Secondary | ICD-10-CM | POA: Diagnosis not present

## 2022-12-17 DIAGNOSIS — L814 Other melanin hyperpigmentation: Secondary | ICD-10-CM | POA: Diagnosis not present

## 2022-12-17 DIAGNOSIS — D485 Neoplasm of uncertain behavior of skin: Secondary | ICD-10-CM | POA: Diagnosis not present

## 2022-12-17 DIAGNOSIS — L700 Acne vulgaris: Secondary | ICD-10-CM | POA: Diagnosis not present

## 2022-12-17 DIAGNOSIS — L570 Actinic keratosis: Secondary | ICD-10-CM | POA: Diagnosis not present

## 2022-12-17 DIAGNOSIS — L281 Prurigo nodularis: Secondary | ICD-10-CM | POA: Diagnosis not present

## 2022-12-17 DIAGNOSIS — R238 Other skin changes: Secondary | ICD-10-CM | POA: Diagnosis not present

## 2023-01-09 DIAGNOSIS — M5416 Radiculopathy, lumbar region: Secondary | ICD-10-CM | POA: Diagnosis not present

## 2023-01-10 DIAGNOSIS — I471 Supraventricular tachycardia, unspecified: Secondary | ICD-10-CM | POA: Diagnosis not present

## 2023-01-10 DIAGNOSIS — E785 Hyperlipidemia, unspecified: Secondary | ICD-10-CM | POA: Diagnosis not present

## 2023-01-10 DIAGNOSIS — Z6838 Body mass index (BMI) 38.0-38.9, adult: Secondary | ICD-10-CM | POA: Diagnosis not present

## 2023-01-10 DIAGNOSIS — R58 Hemorrhage, not elsewhere classified: Secondary | ICD-10-CM | POA: Diagnosis not present

## 2023-01-10 DIAGNOSIS — I2510 Atherosclerotic heart disease of native coronary artery without angina pectoris: Secondary | ICD-10-CM | POA: Diagnosis not present

## 2023-01-10 DIAGNOSIS — R0609 Other forms of dyspnea: Secondary | ICD-10-CM | POA: Diagnosis not present

## 2023-01-10 DIAGNOSIS — I10 Essential (primary) hypertension: Secondary | ICD-10-CM | POA: Diagnosis not present

## 2023-01-10 DIAGNOSIS — R600 Localized edema: Secondary | ICD-10-CM | POA: Diagnosis not present

## 2023-01-16 DIAGNOSIS — M5416 Radiculopathy, lumbar region: Secondary | ICD-10-CM | POA: Diagnosis not present

## 2023-01-27 DIAGNOSIS — W19XXXA Unspecified fall, initial encounter: Secondary | ICD-10-CM | POA: Diagnosis not present

## 2023-01-27 DIAGNOSIS — Z880 Allergy status to penicillin: Secondary | ICD-10-CM | POA: Diagnosis not present

## 2023-01-27 DIAGNOSIS — S92355A Nondisplaced fracture of fifth metatarsal bone, left foot, initial encounter for closed fracture: Secondary | ICD-10-CM | POA: Diagnosis not present

## 2023-01-27 DIAGNOSIS — M79672 Pain in left foot: Secondary | ICD-10-CM | POA: Diagnosis not present

## 2023-01-27 DIAGNOSIS — Z888 Allergy status to other drugs, medicaments and biological substances status: Secondary | ICD-10-CM | POA: Diagnosis not present

## 2023-02-01 DIAGNOSIS — R609 Edema, unspecified: Secondary | ICD-10-CM | POA: Diagnosis not present

## 2023-02-01 DIAGNOSIS — Z881 Allergy status to other antibiotic agents status: Secondary | ICD-10-CM | POA: Diagnosis not present

## 2023-02-01 DIAGNOSIS — S92355A Nondisplaced fracture of fifth metatarsal bone, left foot, initial encounter for closed fracture: Secondary | ICD-10-CM | POA: Diagnosis not present

## 2023-02-01 DIAGNOSIS — Z888 Allergy status to other drugs, medicaments and biological substances status: Secondary | ICD-10-CM | POA: Diagnosis not present

## 2023-02-01 DIAGNOSIS — I10 Essential (primary) hypertension: Secondary | ICD-10-CM | POA: Diagnosis not present

## 2023-02-06 DIAGNOSIS — S92355A Nondisplaced fracture of fifth metatarsal bone, left foot, initial encounter for closed fracture: Secondary | ICD-10-CM | POA: Diagnosis not present

## 2023-02-21 DIAGNOSIS — S92355A Nondisplaced fracture of fifth metatarsal bone, left foot, initial encounter for closed fracture: Secondary | ICD-10-CM | POA: Diagnosis not present

## 2023-03-21 DIAGNOSIS — R21 Rash and other nonspecific skin eruption: Secondary | ICD-10-CM | POA: Diagnosis not present

## 2023-03-21 DIAGNOSIS — Z299 Encounter for prophylactic measures, unspecified: Secondary | ICD-10-CM | POA: Diagnosis not present

## 2023-03-21 DIAGNOSIS — I10 Essential (primary) hypertension: Secondary | ICD-10-CM | POA: Diagnosis not present

## 2023-03-21 DIAGNOSIS — S92309A Fracture of unspecified metatarsal bone(s), unspecified foot, initial encounter for closed fracture: Secondary | ICD-10-CM | POA: Diagnosis not present

## 2023-03-21 DIAGNOSIS — F331 Major depressive disorder, recurrent, moderate: Secondary | ICD-10-CM | POA: Diagnosis not present

## 2023-03-21 DIAGNOSIS — S92355A Nondisplaced fracture of fifth metatarsal bone, left foot, initial encounter for closed fracture: Secondary | ICD-10-CM | POA: Diagnosis not present

## 2023-03-27 DIAGNOSIS — Z299 Encounter for prophylactic measures, unspecified: Secondary | ICD-10-CM | POA: Diagnosis not present

## 2023-03-27 DIAGNOSIS — I10 Essential (primary) hypertension: Secondary | ICD-10-CM | POA: Diagnosis not present

## 2023-03-27 DIAGNOSIS — R21 Rash and other nonspecific skin eruption: Secondary | ICD-10-CM | POA: Diagnosis not present

## 2023-03-27 DIAGNOSIS — F331 Major depressive disorder, recurrent, moderate: Secondary | ICD-10-CM | POA: Diagnosis not present

## 2023-03-27 DIAGNOSIS — Z6841 Body Mass Index (BMI) 40.0 and over, adult: Secondary | ICD-10-CM | POA: Diagnosis not present

## 2023-03-29 DIAGNOSIS — Z6841 Body Mass Index (BMI) 40.0 and over, adult: Secondary | ICD-10-CM | POA: Diagnosis not present

## 2023-03-29 DIAGNOSIS — R21 Rash and other nonspecific skin eruption: Secondary | ICD-10-CM | POA: Diagnosis not present

## 2023-03-29 DIAGNOSIS — Z299 Encounter for prophylactic measures, unspecified: Secondary | ICD-10-CM | POA: Diagnosis not present

## 2023-03-29 DIAGNOSIS — I10 Essential (primary) hypertension: Secondary | ICD-10-CM | POA: Diagnosis not present

## 2023-04-02 DIAGNOSIS — Z299 Encounter for prophylactic measures, unspecified: Secondary | ICD-10-CM | POA: Diagnosis not present

## 2023-04-02 DIAGNOSIS — I10 Essential (primary) hypertension: Secondary | ICD-10-CM | POA: Diagnosis not present

## 2023-04-02 DIAGNOSIS — R21 Rash and other nonspecific skin eruption: Secondary | ICD-10-CM | POA: Diagnosis not present

## 2023-04-02 DIAGNOSIS — I471 Supraventricular tachycardia, unspecified: Secondary | ICD-10-CM | POA: Diagnosis not present

## 2023-04-17 DIAGNOSIS — S92355A Nondisplaced fracture of fifth metatarsal bone, left foot, initial encounter for closed fracture: Secondary | ICD-10-CM | POA: Diagnosis not present

## 2023-04-22 DIAGNOSIS — M5416 Radiculopathy, lumbar region: Secondary | ICD-10-CM | POA: Diagnosis not present

## 2023-05-07 DIAGNOSIS — M5416 Radiculopathy, lumbar region: Secondary | ICD-10-CM | POA: Diagnosis not present

## 2023-05-21 DIAGNOSIS — R0609 Other forms of dyspnea: Secondary | ICD-10-CM | POA: Diagnosis not present

## 2023-05-21 DIAGNOSIS — I471 Supraventricular tachycardia, unspecified: Secondary | ICD-10-CM | POA: Diagnosis not present

## 2023-05-21 DIAGNOSIS — I10 Essential (primary) hypertension: Secondary | ICD-10-CM | POA: Diagnosis not present

## 2023-05-21 DIAGNOSIS — I2510 Atherosclerotic heart disease of native coronary artery without angina pectoris: Secondary | ICD-10-CM | POA: Diagnosis not present

## 2023-05-21 DIAGNOSIS — R600 Localized edema: Secondary | ICD-10-CM | POA: Diagnosis not present

## 2023-05-29 DIAGNOSIS — S92355A Nondisplaced fracture of fifth metatarsal bone, left foot, initial encounter for closed fracture: Secondary | ICD-10-CM | POA: Diagnosis not present

## 2023-06-18 DIAGNOSIS — J069 Acute upper respiratory infection, unspecified: Secondary | ICD-10-CM | POA: Diagnosis not present

## 2023-06-18 DIAGNOSIS — Z299 Encounter for prophylactic measures, unspecified: Secondary | ICD-10-CM | POA: Diagnosis not present

## 2023-06-18 DIAGNOSIS — R5383 Other fatigue: Secondary | ICD-10-CM | POA: Diagnosis not present

## 2023-06-18 DIAGNOSIS — I10 Essential (primary) hypertension: Secondary | ICD-10-CM | POA: Diagnosis not present

## 2023-07-09 DIAGNOSIS — Z299 Encounter for prophylactic measures, unspecified: Secondary | ICD-10-CM | POA: Diagnosis not present

## 2023-07-09 DIAGNOSIS — I10 Essential (primary) hypertension: Secondary | ICD-10-CM | POA: Diagnosis not present

## 2023-07-09 DIAGNOSIS — R52 Pain, unspecified: Secondary | ICD-10-CM | POA: Diagnosis not present

## 2023-07-09 DIAGNOSIS — Z0000 Encounter for general adult medical examination without abnormal findings: Secondary | ICD-10-CM | POA: Diagnosis not present

## 2023-07-09 DIAGNOSIS — Z6839 Body mass index (BMI) 39.0-39.9, adult: Secondary | ICD-10-CM | POA: Diagnosis not present

## 2023-07-09 DIAGNOSIS — Z1331 Encounter for screening for depression: Secondary | ICD-10-CM | POA: Diagnosis not present

## 2023-07-09 DIAGNOSIS — Z79899 Other long term (current) drug therapy: Secondary | ICD-10-CM | POA: Diagnosis not present

## 2023-07-09 DIAGNOSIS — Z1339 Encounter for screening examination for other mental health and behavioral disorders: Secondary | ICD-10-CM | POA: Diagnosis not present

## 2023-07-09 DIAGNOSIS — Z7189 Other specified counseling: Secondary | ICD-10-CM | POA: Diagnosis not present

## 2023-07-09 DIAGNOSIS — F331 Major depressive disorder, recurrent, moderate: Secondary | ICD-10-CM | POA: Diagnosis not present

## 2023-07-09 DIAGNOSIS — R5383 Other fatigue: Secondary | ICD-10-CM | POA: Diagnosis not present

## 2023-07-09 DIAGNOSIS — E7800 Pure hypercholesterolemia, unspecified: Secondary | ICD-10-CM | POA: Diagnosis not present

## 2023-07-09 DIAGNOSIS — E559 Vitamin D deficiency, unspecified: Secondary | ICD-10-CM | POA: Diagnosis not present

## 2023-07-22 DIAGNOSIS — E2839 Other primary ovarian failure: Secondary | ICD-10-CM | POA: Diagnosis not present

## 2023-07-24 DIAGNOSIS — I471 Supraventricular tachycardia, unspecified: Secondary | ICD-10-CM | POA: Diagnosis not present

## 2023-07-24 DIAGNOSIS — R0989 Other specified symptoms and signs involving the circulatory and respiratory systems: Secondary | ICD-10-CM | POA: Diagnosis not present

## 2023-07-24 DIAGNOSIS — Z6838 Body mass index (BMI) 38.0-38.9, adult: Secondary | ICD-10-CM | POA: Diagnosis not present

## 2023-07-24 DIAGNOSIS — R600 Localized edema: Secondary | ICD-10-CM | POA: Diagnosis not present

## 2023-07-24 DIAGNOSIS — I10 Essential (primary) hypertension: Secondary | ICD-10-CM | POA: Diagnosis not present

## 2023-07-24 DIAGNOSIS — R0609 Other forms of dyspnea: Secondary | ICD-10-CM | POA: Diagnosis not present

## 2023-07-24 DIAGNOSIS — R42 Dizziness and giddiness: Secondary | ICD-10-CM | POA: Diagnosis not present

## 2023-07-24 DIAGNOSIS — R58 Hemorrhage, not elsewhere classified: Secondary | ICD-10-CM | POA: Diagnosis not present

## 2023-07-24 DIAGNOSIS — I2510 Atherosclerotic heart disease of native coronary artery without angina pectoris: Secondary | ICD-10-CM | POA: Diagnosis not present

## 2023-07-24 DIAGNOSIS — E785 Hyperlipidemia, unspecified: Secondary | ICD-10-CM | POA: Diagnosis not present

## 2023-07-29 DIAGNOSIS — E785 Hyperlipidemia, unspecified: Secondary | ICD-10-CM | POA: Diagnosis not present

## 2023-07-29 DIAGNOSIS — I2510 Atherosclerotic heart disease of native coronary artery without angina pectoris: Secondary | ICD-10-CM | POA: Diagnosis not present

## 2023-07-29 DIAGNOSIS — R42 Dizziness and giddiness: Secondary | ICD-10-CM | POA: Diagnosis not present

## 2023-07-29 DIAGNOSIS — I10 Essential (primary) hypertension: Secondary | ICD-10-CM | POA: Diagnosis not present

## 2023-07-29 DIAGNOSIS — I6522 Occlusion and stenosis of left carotid artery: Secondary | ICD-10-CM | POA: Diagnosis not present

## 2023-08-21 DIAGNOSIS — M5416 Radiculopathy, lumbar region: Secondary | ICD-10-CM | POA: Diagnosis not present

## 2023-08-27 DIAGNOSIS — M48061 Spinal stenosis, lumbar region without neurogenic claudication: Secondary | ICD-10-CM | POA: Diagnosis not present

## 2023-09-05 DIAGNOSIS — M5416 Radiculopathy, lumbar region: Secondary | ICD-10-CM | POA: Diagnosis not present

## 2023-09-26 DIAGNOSIS — R350 Frequency of micturition: Secondary | ICD-10-CM | POA: Diagnosis not present

## 2023-09-26 DIAGNOSIS — R52 Pain, unspecified: Secondary | ICD-10-CM | POA: Diagnosis not present

## 2023-09-26 DIAGNOSIS — Z299 Encounter for prophylactic measures, unspecified: Secondary | ICD-10-CM | POA: Diagnosis not present

## 2023-09-26 DIAGNOSIS — E7800 Pure hypercholesterolemia, unspecified: Secondary | ICD-10-CM | POA: Diagnosis not present

## 2023-09-26 DIAGNOSIS — I10 Essential (primary) hypertension: Secondary | ICD-10-CM | POA: Diagnosis not present

## 2023-09-26 DIAGNOSIS — M549 Dorsalgia, unspecified: Secondary | ICD-10-CM | POA: Diagnosis not present

## 2023-10-04 DIAGNOSIS — Z9842 Cataract extraction status, left eye: Secondary | ICD-10-CM | POA: Diagnosis not present

## 2023-10-04 DIAGNOSIS — H04123 Dry eye syndrome of bilateral lacrimal glands: Secondary | ICD-10-CM | POA: Diagnosis not present

## 2023-10-04 DIAGNOSIS — H02729 Madarosis of unspecified eye, unspecified eyelid and periocular area: Secondary | ICD-10-CM | POA: Diagnosis not present

## 2023-10-04 DIAGNOSIS — H43811 Vitreous degeneration, right eye: Secondary | ICD-10-CM | POA: Diagnosis not present

## 2023-10-04 DIAGNOSIS — Z961 Presence of intraocular lens: Secondary | ICD-10-CM | POA: Diagnosis not present

## 2023-10-04 DIAGNOSIS — H4301 Vitreous prolapse, right eye: Secondary | ICD-10-CM | POA: Diagnosis not present

## 2023-11-07 DIAGNOSIS — I10 Essential (primary) hypertension: Secondary | ICD-10-CM | POA: Diagnosis not present

## 2023-11-07 DIAGNOSIS — E7800 Pure hypercholesterolemia, unspecified: Secondary | ICD-10-CM | POA: Diagnosis not present

## 2023-11-07 DIAGNOSIS — Z299 Encounter for prophylactic measures, unspecified: Secondary | ICD-10-CM | POA: Diagnosis not present

## 2023-11-07 DIAGNOSIS — R5383 Other fatigue: Secondary | ICD-10-CM | POA: Diagnosis not present

## 2023-11-07 DIAGNOSIS — Z6839 Body mass index (BMI) 39.0-39.9, adult: Secondary | ICD-10-CM | POA: Diagnosis not present

## 2023-12-11 DIAGNOSIS — M5416 Radiculopathy, lumbar region: Secondary | ICD-10-CM | POA: Diagnosis not present

## 2023-12-18 DIAGNOSIS — M5416 Radiculopathy, lumbar region: Secondary | ICD-10-CM | POA: Diagnosis not present

## 2024-01-14 DIAGNOSIS — R42 Dizziness and giddiness: Secondary | ICD-10-CM | POA: Diagnosis not present

## 2024-01-14 DIAGNOSIS — Z6838 Body mass index (BMI) 38.0-38.9, adult: Secondary | ICD-10-CM | POA: Diagnosis not present

## 2024-01-14 DIAGNOSIS — E7849 Other hyperlipidemia: Secondary | ICD-10-CM | POA: Diagnosis not present

## 2024-01-14 DIAGNOSIS — I2510 Atherosclerotic heart disease of native coronary artery without angina pectoris: Secondary | ICD-10-CM | POA: Diagnosis not present

## 2024-01-14 DIAGNOSIS — I471 Supraventricular tachycardia, unspecified: Secondary | ICD-10-CM | POA: Diagnosis not present

## 2024-01-14 DIAGNOSIS — R0609 Other forms of dyspnea: Secondary | ICD-10-CM | POA: Diagnosis not present

## 2024-01-14 DIAGNOSIS — R0989 Other specified symptoms and signs involving the circulatory and respiratory systems: Secondary | ICD-10-CM | POA: Diagnosis not present

## 2024-01-14 DIAGNOSIS — I10 Essential (primary) hypertension: Secondary | ICD-10-CM | POA: Diagnosis not present

## 2024-01-14 DIAGNOSIS — R600 Localized edema: Secondary | ICD-10-CM | POA: Diagnosis not present
